# Patient Record
Sex: Female | Born: 1985 | Race: White | Hispanic: No | Marital: Married | Smoking: Former smoker
Health system: Southern US, Community
[De-identification: ages and names within clinical notes are randomized; demographics above are authoritative.]

## PROBLEM LIST (undated history)

## (undated) DIAGNOSIS — T7840XA Allergy, unspecified, initial encounter: Secondary | ICD-10-CM

## (undated) DIAGNOSIS — Z789 Other specified health status: Secondary | ICD-10-CM

## (undated) DIAGNOSIS — K219 Gastro-esophageal reflux disease without esophagitis: Secondary | ICD-10-CM

## (undated) HISTORY — DX: Other specified health status: Z78.9

## (undated) HISTORY — PX: NO PAST SURGERIES: SHX2092

## (undated) HISTORY — DX: Allergy, unspecified, initial encounter: T78.40XA

---

## 2014-07-30 ENCOUNTER — Other Ambulatory Visit: Payer: Self-pay | Admitting: Obstetrics & Gynecology

## 2014-07-30 DIAGNOSIS — O3680X1 Pregnancy with inconclusive fetal viability, fetus 1: Secondary | ICD-10-CM

## 2014-08-02 ENCOUNTER — Other Ambulatory Visit: Payer: Self-pay | Admitting: Obstetrics & Gynecology

## 2014-08-02 ENCOUNTER — Ambulatory Visit (INDEPENDENT_AMBULATORY_CARE_PROVIDER_SITE_OTHER): Payer: Medicaid Other

## 2014-08-02 DIAGNOSIS — O3680X1 Pregnancy with inconclusive fetal viability, fetus 1: Secondary | ICD-10-CM | POA: Diagnosis not present

## 2014-08-02 DIAGNOSIS — O30042 Twin pregnancy, dichorionic/diamniotic, second trimester: Secondary | ICD-10-CM | POA: Diagnosis not present

## 2014-08-02 DIAGNOSIS — Z36 Encounter for antenatal screening of mother: Secondary | ICD-10-CM

## 2014-08-02 DIAGNOSIS — IMO0001 Reserved for inherently not codable concepts without codable children: Secondary | ICD-10-CM

## 2014-08-02 NOTE — Progress Notes (Signed)
[place] 8wks dichorionic/diamniotic twins w/ys,pos fht,normal ov's bilat  BABY A :CRL 72mm,fht 172bpm                                                                                                                BABY B :CRL 19.12mm,fht 167

## 2014-08-05 LAB — US OB COMP ADDL GEST LESS 14 WKS

## 2014-08-11 ENCOUNTER — Ambulatory Visit (INDEPENDENT_AMBULATORY_CARE_PROVIDER_SITE_OTHER): Payer: Medicaid Other | Admitting: Advanced Practice Midwife

## 2014-08-11 ENCOUNTER — Other Ambulatory Visit (HOSPITAL_COMMUNITY)
Admission: RE | Admit: 2014-08-11 | Discharge: 2014-08-11 | Disposition: A | Discharge status: Home / Self Care | Payer: Self-pay | Source: Ambulatory Visit | Attending: Advanced Practice Midwife | Admitting: Advanced Practice Midwife

## 2014-08-11 ENCOUNTER — Encounter: Payer: Self-pay | Admitting: Advanced Practice Midwife

## 2014-08-11 VITALS — BP 110/78 | HR 84 | Ht 61.0 in | Wt 171.0 lb

## 2014-08-11 DIAGNOSIS — Z0283 Encounter for blood-alcohol and blood-drug test: Secondary | ICD-10-CM

## 2014-08-11 DIAGNOSIS — O30049 Twin pregnancy, dichorionic/diamniotic, unspecified trimester: Secondary | ICD-10-CM | POA: Insufficient documentation

## 2014-08-11 DIAGNOSIS — O2341 Unspecified infection of urinary tract in pregnancy, first trimester: Secondary | ICD-10-CM | POA: Diagnosis not present

## 2014-08-11 DIAGNOSIS — Z369 Encounter for antenatal screening, unspecified: Secondary | ICD-10-CM

## 2014-08-11 DIAGNOSIS — Z331 Pregnant state, incidental: Secondary | ICD-10-CM

## 2014-08-11 DIAGNOSIS — Z113 Encounter for screening for infections with a predominantly sexual mode of transmission: Secondary | ICD-10-CM | POA: Insufficient documentation

## 2014-08-11 DIAGNOSIS — O0991 Supervision of high risk pregnancy, unspecified, first trimester: Secondary | ICD-10-CM | POA: Diagnosis not present

## 2014-08-11 DIAGNOSIS — Z01411 Encounter for gynecological examination (general) (routine) with abnormal findings: Secondary | ICD-10-CM | POA: Insufficient documentation

## 2014-08-11 DIAGNOSIS — Z1389 Encounter for screening for other disorder: Secondary | ICD-10-CM

## 2014-08-11 DIAGNOSIS — O0990 Supervision of high risk pregnancy, unspecified, unspecified trimester: Secondary | ICD-10-CM | POA: Insufficient documentation

## 2014-08-11 DIAGNOSIS — Z124 Encounter for screening for malignant neoplasm of cervix: Secondary | ICD-10-CM

## 2014-08-11 DIAGNOSIS — Z3682 Encounter for antenatal screening for nuchal translucency: Secondary | ICD-10-CM

## 2014-08-11 DIAGNOSIS — O30041 Twin pregnancy, dichorionic/diamniotic, first trimester: Secondary | ICD-10-CM | POA: Diagnosis not present

## 2014-08-11 LAB — POCT URINALYSIS DIPSTICK
Glucose, UA: NEGATIVE
Ketones, UA: NEGATIVE
Leukocytes, UA: NEGATIVE
Nitrite, UA: NEGATIVE
PROTEIN UA: NEGATIVE
RBC UA: NEGATIVE

## 2014-08-11 NOTE — Progress Notes (Addendum)
  Subjective:    Natalie Knapp is a G1P0 4841w2d being seen today for her first obstetrical visit.  Her obstetrical history is significant for first pregnancy.  Pregnancy history fully reviewed.  Patient reports nausea daily with vomiting.  Has gained 6 lbs.  Had some vulvar itching once or twice after shaving.  Denies UTI sx  Filed Vitals:   08/11/14 0842 08/11/14 0851  BP: 110/78   Pulse: 84   Height:  5\' 1"  (1.549 m)  Weight: 171 lb (77.565 kg)     HISTORY: OB History  Gravida Para Term Preterm AB SAB TAB Ectopic Multiple Living  1             # Outcome Date GA Lbr Len/2nd Weight Sex Delivery Anes PTL Lv  1 Current              Past Medical History  Diagnosis Date  . Medical history non-contributory    Past Surgical History  Procedure Laterality Date  . No past surgeries     Family History  Problem Relation Age of Onset  . Scoliosis Mother   . Arthritis Mother   . Cancer Father     colon, lung, skin  . Heart disease Father   . Heart disease Maternal Grandmother   . Stroke Maternal Grandmother   . Thyroid disease Maternal Grandmother   . Hypertension Maternal Grandmother   . Diabetes Maternal Grandfather   . Heart disease Maternal Grandfather   . Heart disease Paternal Grandmother   . Diabetes Paternal Grandmother   . Dementia Paternal Grandmother   . Cancer Paternal Grandfather     melanoma     Exam       Pelvic Exam:    Perineum: Normal Perineum   Vulva: normal   Vagina:  normal mucosa, normal discharge, no palpable nodules   Uterus Normal, Gravid,     Cervix: normal   Adnexa: Not palpable   Urinary:  urethral meatus normal    System:     Skin: normal coloration and turgor, no rashes    Neurologic: oriented, normal, normal mood   Extremities: normal strength, tone, and muscle mass   HEENT PERRLA   Mouth/Teeth mucous membranes moist, normal dentition   Neck supple and no masses   Cardiovascular: regular rate and rhythm   Respiratory:  appears  well, vitals normal, no respiratory distress, acyanotic   Abdomen: soft, non-tender;  FHR: 155/160 US          Assessment:    Pregnancy: G1P0 Patient Active Problem List   Diagnosis Date Noted  . Supervision of high risk pregnancy in first trimester 08/11/2014  . Dichorionic diamniotic twin pregnancy in first trimester 08/11/2014        Plan:     Initial labs drawn. Continue prenatal vitamins  Problem list reviewed and updated  Reviewed n/v relief measures and warning s/s to report; Diclegis samples given, call for Rx if it works well  Rx Macrobid 100mg  BID X 7 for ASB Reviewed recommended weight gain based on pre-gravid BMI  Encouraged well-balanced diet Genetic Screening discussed Integrated Screen: requested.  Ultrasound discussed; fetal survey: requested.  Follow up in 3 weeks for NT/IT/HROB.  CRESENZO-DISHMAN,Aariyah Sampey 08/11/2014

## 2014-08-11 NOTE — Patient Instructions (Signed)
First Trimester of Pregnancy The first trimester of pregnancy is from week 1 until the end of week 12 (months 1 through 3). A week after a sperm fertilizes an egg, the egg will implant on the wall of the uterus. This embryo will begin to develop into a baby. Genes from you and your partner are forming the baby. The female genes determine whether the baby is a boy or a girl. At 6-8 weeks, the eyes and face are formed, and the heartbeat can be seen on ultrasound. At the end of 12 weeks, all the baby's organs are formed.  Now that you are pregnant, you will want to do everything you can to have a healthy baby. Two of the most important things are to get good prenatal care and to follow your health care provider's instructions. Prenatal care is all the medical care you receive before the baby's birth. This care will help prevent, find, and treat any problems during the pregnancy and childbirth. BODY CHANGES Your body goes through many changes during pregnancy. The changes vary from woman to woman.   You may gain or lose a couple of pounds at first.  You may feel sick to your stomach (nauseous) and throw up (vomit). If the vomiting is uncontrollable, call your health care provider.  You may tire easily.  You may develop headaches that can be relieved by medicines approved by your health care provider.  You may urinate more often. Painful urination may mean you have a bladder infection.  You may develop heartburn as a result of your pregnancy.  You may develop constipation because certain hormones are causing the muscles that push waste through your intestines to slow down.  You may develop hemorrhoids or swollen, bulging veins (varicose veins).  Your breasts may begin to grow larger and become tender. Your nipples may stick out more, and the tissue that surrounds them (areola) may become darker.  Your gums may bleed and may be sensitive to brushing and flossing.  Dark spots or blotches (chloasma,  mask of pregnancy) may develop on your face. This will likely fade after the baby is born.  Your menstrual periods will stop.  You may have a loss of appetite.  You may develop cravings for certain kinds of food.  You may have changes in your emotions from day to day, such as being excited to be pregnant or being concerned that something may go wrong with the pregnancy and baby.  You may have more vivid and strange dreams.  You may have changes in your hair. These can include thickening of your hair, rapid growth, and changes in texture. Some women also have hair loss during or after pregnancy, or hair that feels dry or thin. Your hair will most likely return to normal after your baby is born. WHAT TO EXPECT AT YOUR PRENATAL VISITS During a routine prenatal visit:  You will be weighed to make sure you and the baby are growing normally.  Your blood pressure will be taken.  Your abdomen will be measured to track your baby's growth.  The fetal heartbeat will be listened to starting around week 10 or 12 of your pregnancy.  Test results from any previous visits will be discussed. Your health care provider may ask you:  How you are feeling.  If you are feeling the baby move.  If you have had any abnormal symptoms, such as leaking fluid, bleeding, severe headaches, or abdominal cramping.  If you have any questions. Other tests   that may be performed during your first trimester include:  Blood tests to find your blood type and to check for the presence of any previous infections. They will also be used to check for low iron levels (anemia) and Rh antibodies. Later in the pregnancy, blood tests for diabetes will be done along with other tests if problems develop.  Urine tests to check for infections, diabetes, or protein in the urine.  An ultrasound to confirm the proper growth and development of the baby.  An amniocentesis to check for possible genetic problems.  Fetal screens for  spina bifida and Down syndrome.  You may need other tests to make sure you and the baby are doing well. HOME CARE INSTRUCTIONS  Medicines  Follow your health care provider's instructions regarding medicine use. Specific medicines may be either safe or unsafe to take during pregnancy.  Take your prenatal vitamins as directed.  If you develop constipation, try taking a stool softener if your health care provider approves. Diet  Eat regular, well-balanced meals. Choose a variety of foods, such as meat or vegetable-based protein, fish, milk and low-fat dairy products, vegetables, fruits, and whole grain breads and cereals. Your health care provider will help you determine the amount of weight gain that is right for you.  Avoid raw meat and uncooked cheese. These carry germs that can cause birth defects in the baby.  Eating four or five small meals rather than three large meals a day may help relieve nausea and vomiting. If you start to feel nauseous, eating a few soda crackers can be helpful. Drinking liquids between meals instead of during meals also seems to help nausea and vomiting.  If you develop constipation, eat more high-fiber foods, such as fresh vegetables or fruit and whole grains. Drink enough fluids to keep your urine clear or pale yellow. Activity and Exercise  Exercise only as directed by your health care provider. Exercising will help you:  Control your weight.  Stay in shape.  Be prepared for labor and delivery.  Experiencing pain or cramping in the lower abdomen or low back is a good sign that you should stop exercising. Check with your health care provider before continuing normal exercises.  Try to avoid standing for long periods of time. Move your legs often if you must stand in one place for a long time.  Avoid heavy lifting.  Wear low-heeled shoes, and practice good posture.  You may continue to have sex unless your health care provider directs you  otherwise. Relief of Pain or Discomfort  Wear a good support bra for breast tenderness.   Take warm sitz baths to soothe any pain or discomfort caused by hemorrhoids. Use hemorrhoid cream if your health care provider approves.   Rest with your legs elevated if you have leg cramps or low back pain.  If you develop varicose veins in your legs, wear support hose. Elevate your feet for 15 minutes, 3-4 times a day. Limit salt in your diet. Prenatal Care  Schedule your prenatal visits by the twelfth week of pregnancy. They are usually scheduled monthly at first, then more often in the last 2 months before delivery.  Write down your questions. Take them to your prenatal visits.  Keep all your prenatal visits as directed by your health care provider. Safety  Wear your seat belt at all times when driving.  Make a list of emergency phone numbers, including numbers for family, friends, the hospital, and police and fire departments. General Tips    Ask your health care provider for a referral to a local prenatal education class. Begin classes no later than at the beginning of month 6 of your pregnancy.  Ask for help if you have counseling or nutritional needs during pregnancy. Your health care provider can offer advice or refer you to specialists for help with various needs.  Do not use hot tubs, steam rooms, or saunas.  Do not douche or use tampons or scented sanitary pads.  Do not cross your legs for long periods of time.  Avoid cat litter boxes and soil used by cats. These carry germs that can cause birth defects in the baby and possibly loss of the fetus by miscarriage or stillbirth.  Avoid all smoking, herbs, alcohol, and medicines not prescribed by your health care provider. Chemicals in these affect the formation and growth of the baby.  Schedule a dentist appointment. At home, brush your teeth with a soft toothbrush and be gentle when you floss. SEEK MEDICAL CARE IF:   You have  dizziness.  You have mild pelvic cramps, pelvic pressure, or nagging pain in the abdominal area.  You have persistent nausea, vomiting, or diarrhea.  You have a bad smelling vaginal discharge.  You have pain with urination.  You notice increased swelling in your face, hands, legs, or ankles. SEEK IMMEDIATE MEDICAL CARE IF:   You have a fever.  You are leaking fluid from your vagina.  You have spotting or bleeding from your vagina.  You have severe abdominal cramping or pain.  You have rapid weight gain or loss.  You vomit blood or material that looks like coffee grounds.  You are exposed to German measles and have never had them.  You are exposed to fifth disease or chickenpox.  You develop a severe headache.  You have shortness of breath.  You have any kind of trauma, such as from a fall or a car accident. Document Released: 01/23/2001 Document Revised: 06/15/2013 Document Reviewed: 12/09/2012 [employer] Patient Information 2015 [employer]. This information is not intended to replace advice given to you by your health care provider. Make sure you discuss any questions you have with your health care provider.  

## 2014-08-12 ENCOUNTER — Encounter: Payer: Self-pay | Admitting: Advanced Practice Midwife

## 2014-08-12 DIAGNOSIS — F1190 Opioid use, unspecified, uncomplicated: Secondary | ICD-10-CM | POA: Insufficient documentation

## 2014-08-12 DIAGNOSIS — F1290 Cannabis use, unspecified, uncomplicated: Secondary | ICD-10-CM | POA: Insufficient documentation

## 2014-08-12 LAB — CYTOLOGY - PAP

## 2014-08-12 MED ORDER — NITROFURANTOIN MONOHYD MACRO 100 MG PO CAPS: 100.0000 mg | ORAL_CAPSULE | Freq: Two times a day (BID) | ORAL | Status: AC

## 2014-08-12 NOTE — Addendum Note (Signed)
Addended by: Jacklyn ShellRESENZO-DISHMON, Marianna Cid on: 08/12/2014 10:56 AM   Modules accepted: Orders

## 2014-08-13 LAB — URINE CULTURE

## 2014-08-18 LAB — PMP SCREEN PROFILE (10S), URINE
AMPHETAMINE SCRN UR: NEGATIVE ng/mL
BARBITURATE SCRN UR: NEGATIVE ng/mL
BENZODIAZEPINE SCREEN, URINE: NEGATIVE ng/mL
Cannabinoids Ur Ql Scn: POSITIVE ng/mL
Cocaine(Metab.)Screen, Urine: NEGATIVE ng/mL
Creatinine(Crt), U: 172.7 mg/dL (ref 20.0–300.0)
METHADONE SCREEN, URINE: NEGATIVE ng/mL
Opiate Scrn, Ur: POSITIVE ng/mL
Oxycodone+Oxymorphone Ur Ql Scn: POSITIVE ng/mL
PCP Scrn, Ur: NEGATIVE ng/mL
PROPOXYPHENE SCREEN: NEGATIVE ng/mL
Ph of Urine: 8.1 (ref 4.5–8.9)

## 2014-08-18 LAB — URINALYSIS, ROUTINE W REFLEX MICROSCOPIC
BILIRUBIN UA: NEGATIVE
Glucose, UA: NEGATIVE
Ketones, UA: NEGATIVE
NITRITE UA: POSITIVE — AB
PH UA: 8 — AB (ref 5.0–7.5)
RBC UA: NEGATIVE
Specific Gravity, UA: 1.023 (ref 1.005–1.030)
UUROB: 0.2 mg/dL (ref 0.2–1.0)

## 2014-08-18 LAB — HEPATITIS B SURFACE ANTIGEN: HEP B S AG: NEGATIVE

## 2014-08-18 LAB — CBC
HEMOGLOBIN: 13.3 g/dL (ref 11.1–15.9)
Hematocrit: 37.3 % (ref 34.0–46.6)
MCH: 32.7 pg (ref 26.6–33.0)
MCHC: 35.7 g/dL (ref 31.5–35.7)
MCV: 92 fL (ref 79–97)
Platelets: 382 10*3/uL — ABNORMAL HIGH (ref 150–379)
RBC: 4.07 x10E6/uL (ref 3.77–5.28)
RDW: 13.3 % (ref 12.3–15.4)
WBC: 12.3 10*3/uL — AB (ref 3.4–10.8)

## 2014-08-18 LAB — CYSTIC FIBROSIS MUTATION 97: GENE DIS ANAL CARRIER INTERP BLD/T-IMP: NOT DETECTED

## 2014-08-18 LAB — MICROSCOPIC EXAMINATION: Casts: NONE SEEN /lpf

## 2014-08-18 LAB — ABO/RH: RH TYPE: POSITIVE

## 2014-08-18 LAB — HIV ANTIBODY (ROUTINE TESTING W REFLEX): HIV SCREEN 4TH GENERATION: NONREACTIVE

## 2014-08-18 LAB — VARICELLA ZOSTER ANTIBODY, IGG: Varicella zoster IgG: 897 index (ref >165)

## 2014-08-18 LAB — RUBELLA SCREEN: RUBELLA: 1.22 {index} (ref >0.99)

## 2014-08-18 LAB — RPR: RPR Ser Ql: NONREACTIVE

## 2014-08-18 LAB — ANTIBODY SCREEN: ANTIBODY SCREEN: NEGATIVE

## 2014-08-23 ENCOUNTER — Encounter: Payer: Self-pay | Admitting: Women's Health

## 2014-08-27 ENCOUNTER — Other Ambulatory Visit: Payer: Self-pay | Admitting: Advanced Practice Midwife

## 2014-08-27 DIAGNOSIS — Z3682 Encounter for antenatal screening for nuchal translucency: Secondary | ICD-10-CM

## 2014-08-27 DIAGNOSIS — O30041 Twin pregnancy, dichorionic/diamniotic, first trimester: Secondary | ICD-10-CM

## 2014-09-01 ENCOUNTER — Encounter: Payer: Self-pay | Admitting: Women's Health

## 2014-09-01 ENCOUNTER — Ambulatory Visit (INDEPENDENT_AMBULATORY_CARE_PROVIDER_SITE_OTHER): Payer: Medicaid Other

## 2014-09-01 ENCOUNTER — Ambulatory Visit (INDEPENDENT_AMBULATORY_CARE_PROVIDER_SITE_OTHER): Payer: Medicaid Other | Admitting: Women's Health

## 2014-09-01 VITALS — BP 102/60 | HR 96 | Wt 171.0 lb

## 2014-09-01 DIAGNOSIS — O30041 Twin pregnancy, dichorionic/diamniotic, first trimester: Secondary | ICD-10-CM | POA: Diagnosis not present

## 2014-09-01 DIAGNOSIS — Z331 Pregnant state, incidental: Secondary | ICD-10-CM

## 2014-09-01 DIAGNOSIS — O0991 Supervision of high risk pregnancy, unspecified, first trimester: Secondary | ICD-10-CM

## 2014-09-01 DIAGNOSIS — O30049 Twin pregnancy, dichorionic/diamniotic, unspecified trimester: Secondary | ICD-10-CM

## 2014-09-01 DIAGNOSIS — Z3682 Encounter for antenatal screening for nuchal translucency: Secondary | ICD-10-CM

## 2014-09-01 DIAGNOSIS — Z36 Encounter for antenatal screening of mother: Secondary | ICD-10-CM | POA: Diagnosis not present

## 2014-09-01 DIAGNOSIS — Z1389 Encounter for screening for other disorder: Secondary | ICD-10-CM | POA: Diagnosis not present

## 2014-09-01 DIAGNOSIS — O30042 Twin pregnancy, dichorionic/diamniotic, second trimester: Secondary | ICD-10-CM

## 2014-09-01 DIAGNOSIS — F1190 Opioid use, unspecified, uncomplicated: Secondary | ICD-10-CM

## 2014-09-01 DIAGNOSIS — F1290 Cannabis use, unspecified, uncomplicated: Secondary | ICD-10-CM

## 2014-09-01 LAB — POCT URINALYSIS DIPSTICK
Blood, UA: NEGATIVE
GLUCOSE UA: NEGATIVE
NITRITE UA: NEGATIVE

## 2014-09-01 NOTE — Progress Notes (Signed)
High Risk Pregnancy Diagnosis(es): Di-Di Twin pregnancy G1P0 3335w2d Estimated Date of Delivery: 03/14/15 BP 102/60 mmHg  Pulse 96  Wt 171 lb (77.565 kg)  LMP 06/07/2014 (Exact Date)  Urinalysis: Positive for tr protein HPI:  Doing well BP, weight, and urine reviewed.  No fm yet. Denies cramping, lof, vb, uti s/s. No complaints. Counseled about +THC & opiates on UDS- states she does use THC about 2-3x/wk d/t n/v- advised complete cessation- to use diclegis she has for n/v Reports she took 1/2 percocet from her friend a couple of days before UDS d/t back pain, also took 1/2 of one the other day. Advised to stop- to use apap, warm baths, heating pads, etc for pain  Fundal Height:  12wks Fetal Heart rate:  161/153 u/s Edema:  none  Reviewed today's normal twin nt us, s/s to report All questions were answered Assessment: 5735w2d di di twin gestation Medication(s) Plans:  n/a Treatment Plan:  q 4wk u/s after 20wks, then 2x/wk testing @ 35wk w/ delivery @ 38wks Follow up in 4wks for high-risk OB appt and 2nd IT 1st IT/NT today

## 2014-09-01 NOTE — Patient Instructions (Signed)
Nausea & Vomiting  Have saltine crackers or pretzels by your bed and eat a few bites before you raise your head out of bed in the morning  Eat small frequent meals throughout the day instead of large meals  Drink plenty of fluids throughout the day to stay hydrated, just don't drink a lot of fluids with your meals.  This can make your stomach fill up faster making you feel sick  Do not brush your teeth right after you eat  Products with real ginger are good for nausea, like ginger ale and ginger hard candy Make sure it says made with real ginger!  Sucking on sour candy like lemon heads is also good for nausea  If your prenatal vitamins make you nauseated, take them at night so you will sleep through the nausea  Sea Bands  If you feel like you need medicine for the nausea & vomiting please let us know  If you are unable to keep any fluids or food down please let us know    Second Trimester of Pregnancy The second trimester is from week 13 through week 28, months 4 through 6. The second trimester is often a time when you feel your best. Your body has also adjusted to being pregnant, and you begin to feel better physically. Usually, morning sickness has lessened or quit completely, you may have more energy, and you may have an increase in appetite. The second trimester is also a time when the fetus is growing rapidly. At the end of the sixth month, the fetus is about 9 inches long and weighs about 1 pounds. You will likely begin to feel the baby move (quickening) between 18 and 20 weeks of the pregnancy. BODY CHANGES Your body goes through many changes during pregnancy. The changes vary from woman to woman.   Your weight will continue to increase. You will notice your lower abdomen bulging out.  You may begin to get stretch marks on your hips, abdomen, and breasts.  You may develop headaches that can be relieved by medicines approved by your health care provider.  You may urinate more  often because the fetus is pressing on your bladder.  You may develop or continue to have heartburn as a result of your pregnancy.  You may develop constipation because certain hormones are causing the muscles that push waste through your intestines to slow down.  You may develop hemorrhoids or swollen, bulging veins (varicose veins).  You may have back pain because of the weight gain and pregnancy hormones relaxing your joints between the bones in your pelvis and as a result of a shift in weight and the muscles that support your balance.  Your breasts will continue to grow and be tender.  Your gums may bleed and may be sensitive to brushing and flossing.  Dark spots or blotches (chloasma, mask of pregnancy) may develop on your face. This will likely fade after the baby is born.  A dark line from your belly button to the pubic area (linea nigra) may appear. This will likely fade after the baby is born.  You may have changes in your hair. These can include thickening of your hair, rapid growth, and changes in texture. Some women also have hair loss during or after pregnancy, or hair that feels dry or thin. Your hair will most likely return to normal after your baby is born. WHAT TO EXPECT AT YOUR PRENATAL VISITS During a routine prenatal visit:  You will be weighed to  sure you and the fetus are growing normally.  Your blood pressure will be taken.  Your abdomen will be measured to track your baby's growth.  The fetal heartbeat will be listened to.  Any test results from the previous visit will be discussed. Your health care provider may ask you:  How you are feeling.  If you are feeling the baby move.  If you have had any abnormal symptoms, such as leaking fluid, bleeding, severe headaches, or abdominal cramping.  If you have any questions. Other tests that may be performed during your second trimester include:  Blood tests that check for:  Low iron levels  (anemia).  Gestational diabetes (between 24 and 28 weeks).  Rh antibodies.  Urine tests to check for infections, diabetes, or protein in the urine.  An ultrasound to confirm the proper growth and development of the baby.  An amniocentesis to check for possible genetic problems.  Fetal screens for spina bifida and Down syndrome. HOME CARE INSTRUCTIONS   Avoid all smoking, herbs, alcohol, and unprescribed drugs. These chemicals affect the formation and growth of the baby.  Follow your health care provider's instructions regarding medicine use. There are medicines that are either safe or unsafe to take during pregnancy.  Exercise only as directed by your health care provider. Experiencing uterine cramps is a good sign to stop exercising.  Continue to eat regular, healthy meals.  Wear a good support bra for breast tenderness.  Do not use hot tubs, steam rooms, or saunas.  Wear your seat belt at all times when driving.  Avoid raw meat, uncooked cheese, cat litter boxes, and soil used by cats. These carry germs that can cause birth defects in the baby.  Take your prenatal vitamins.  Try taking a stool softener (if your health care provider approves) if you develop constipation. Eat more high-fiber foods, such as fresh vegetables or fruit and whole grains. Drink plenty of fluids to keep your urine clear or pale yellow.  Take warm sitz baths to soothe any pain or discomfort caused by hemorrhoids. Use hemorrhoid cream if your health care provider approves.  If you develop varicose veins, wear support hose. Elevate your feet for 15 minutes, 3-4 times a day. Limit salt in your diet.  Avoid heavy lifting, wear low heel shoes, and practice good posture.  Rest with your legs elevated if you have leg cramps or low back pain.  Visit your dentist if you have not gone yet during your pregnancy. Use a soft toothbrush to brush your teeth and be gentle when you floss.  A sexual relationship  may be continued unless your health care provider directs you otherwise.  Continue to go to all your prenatal visits as directed by your health care provider. SEEK MEDICAL CARE IF:   You have dizziness.  You have mild pelvic cramps, pelvic pressure, or nagging pain in the abdominal area.  You have persistent nausea, vomiting, or diarrhea.  You have a bad smelling vaginal discharge.  You have pain with urination. SEEK IMMEDIATE MEDICAL CARE IF:   You have a fever.  You are leaking fluid from your vagina.  You have spotting or bleeding from your vagina.  You have severe abdominal cramping or pain.  You have rapid weight gain or loss.  You have shortness of breath with chest pain.  You notice sudden or extreme swelling of your face, hands, ankles, feet, or legs.  You have not felt your baby move in over an hour.    You have severe headaches that do not go away with medicine.  You have vision changes. Document Released: 01/23/2001 Document Revised: 02/03/2013 Document Reviewed: 04/01/2012 [facility] Patient Information 2015 [place], [place]. This information is not intended to replace advice given to you by your health care provider. Make sure you discuss any questions you have with your health care provider.  Heartburn During Pregnancy  Heartburn is a burning sensation in the chest caused by stomach acid backing up into the esophagus. Heartburn is common in pregnancy because a certain hormone (progesterone) is released when a woman is pregnant. The progesterone hormone may relax the valve that separates the esophagus from the stomach. This allows acid to go up into the esophagus, causing heartburn. Heartburn may also happen in pregnancy because the enlarging uterus pushes up on the stomach, which pushes more acid into the esophagus. This is especially true in the later stages of pregnancy. Heartburn problems usually go away after giving birth. CAUSES  Heartburn is caused by stomach  acid backing up into the esophagus. During pregnancy, this may result from various things, including:   The progesterone hormone.  Changing hormone levels.  The growing uterus pushing stomach acid upward.  Large meals.  Certain foods and drinks.  Exercise.  Increased acid production. SIGNS AND SYMPTOMS   Burning pain in the chest or lower throat.  Bitter taste in the mouth.  Coughing. DIAGNOSIS  Your health care provider will typically diagnose heartburn by taking a careful history of your concern. Blood tests may be done to check for a certain type of bacteria that is associated with heartburn. Sometimes, heartburn is diagnosed by prescribing a heartburn medicine to see if the symptoms improve. In some cases, a procedure called an endoscopy may be done. In this procedure, a tube with a light and a camera on the end (endoscope) is used to examine the esophagus and the stomach. TREATMENT  Treatment will vary depending on the severity of your symptoms. Your health care provider may recommend:  Over-the-counter medicines (antacids, acid reducers) for mild heartburn.  Prescription medicines to decrease stomach acid or to protect your stomach lining.  Certain changes in your diet.  Elevating the head of your bed by putting blocks under the legs. This helps prevent stomach acid from backing up into the esophagus when you are lying down. HOME CARE INSTRUCTIONS   Only take over-the-counter or prescription medicines as directed by your health care provider.  Raise the head of your bed by putting blocks under the legs if instructed to do so by your health care provider. Sleeping with more pillows is not effective because it only changes the position of your head.  Do not exercise right after eating.  Avoid eating 2-3 hours before bed. Do not lie down right after eating.  Eat small meals throughout the day instead of three large meals.  Identify foods and beverages that make your  symptoms worse and avoid them. Foods you may want to avoid include:  Peppers.  Chocolate.  High-fat foods, including fried foods.  Spicy foods.  Garlic and onions.  Citrus fruits, including oranges, grapefruit, lemons, and limes.  Food containing tomatoes or tomato products.  Mint.  Carbonated and caffeinated drinks.  Vinegar. SEEK MEDICAL CARE IF:  You have abdominal pain of any kind.  You feel burning in your upper abdomen or chest, especially after eating or lying down.  You have nausea and vomiting.  Your stomach feels upset after you eat. SEEK IMMEDIATE MEDICAL CARE IF:  You have severe chest pain that goes down your arm or into your jaw or neck.  You feel sweaty, dizzy, or light-headed.  You become short of breath.  You vomit blood.  You have difficulty or pain with swallowing.  You have bloody or black, tarry stools.  You have episodes of heartburn more than 3 times a week, for more than 2 weeks. MAKE SURE YOU:  Understand these instructions.  Will watch your condition.  Will get help right away if you are not doing well or get worse. Document Released: 01/27/2000 Document Revised: 02/03/2013 Document Reviewed: 09/17/2012 [facility] Patient Information 2015 [place], [place]. This information is not intended to replace advice given to you by your health care provider. Make sure you discuss any questions you have with your health care provider.

## 2014-09-01 NOTE — Progress Notes (Signed)
US TWINS:DI/DI 12+2wks,measurements c/w dates, TWIN A:inferior,post pl,crl 66.44mm,NT 2.632mm,NB present,normal ov's bilat,fht 161bpm                                                                                        TWIN B:superior,post pl,crl 63.462mm,NT 1.289mm,NB present,fht 153bpm

## 2014-09-03 LAB — MATERNAL SCREEN, INTEGRATED #1
Crown Rump Length Twin B: 63.2 mm
Crown Rump Length: 66.4 mm
GEST. AGE ON COLLECTION DATE: 12.9 wk
Maternal Age at EDD: 29.1 years
NT TWIN B: 1.9 mm
NUMBER OF FETUSES: 2
Nuchal Translucency (NT): 2.2 mm
PAPP-A Value: 2247.3 ng/mL
WEIGHT: 171 [lb_av]

## 2014-09-29 ENCOUNTER — Encounter: Payer: Self-pay | Admitting: Obstetrics and Gynecology

## 2014-10-05 ENCOUNTER — Ambulatory Visit (INDEPENDENT_AMBULATORY_CARE_PROVIDER_SITE_OTHER): Payer: Medicaid Other | Admitting: Obstetrics and Gynecology

## 2014-10-05 ENCOUNTER — Encounter: Payer: Self-pay | Admitting: Obstetrics and Gynecology

## 2014-10-05 VITALS — BP 120/76 | HR 112 | Wt 173.0 lb

## 2014-10-05 DIAGNOSIS — Z331 Pregnant state, incidental: Secondary | ICD-10-CM

## 2014-10-05 DIAGNOSIS — O30042 Twin pregnancy, dichorionic/diamniotic, second trimester: Secondary | ICD-10-CM | POA: Diagnosis not present

## 2014-10-05 DIAGNOSIS — Z3A17 17 weeks gestation of pregnancy: Secondary | ICD-10-CM

## 2014-10-05 DIAGNOSIS — O30002 Twin pregnancy, unspecified number of placenta and unspecified number of amniotic sacs, second trimester: Secondary | ICD-10-CM

## 2014-10-05 DIAGNOSIS — Z1389 Encounter for screening for other disorder: Secondary | ICD-10-CM | POA: Diagnosis not present

## 2014-10-05 DIAGNOSIS — O0992 Supervision of high risk pregnancy, unspecified, second trimester: Secondary | ICD-10-CM | POA: Diagnosis not present

## 2014-10-05 LAB — POCT URINALYSIS DIPSTICK
Blood, UA: NEGATIVE
Glucose, UA: NEGATIVE
Leukocytes, UA: NEGATIVE
Nitrite, UA: NEGATIVE

## 2014-10-05 NOTE — Progress Notes (Signed)
High Risk Pregnancy Diagnosis(es):  Di/Di Twins @ 17 wk   Blood pressure 120/76, pulse 112, weight 173 lb (78.472 kg), last menstrual period 06/07/2014. HPI: G1P0 [redacted]w[redacted]d Estimated Date of Delivery: 03/14/15         BP weight and urine results reviewed and notable for negative. Patient reports    good fetal movement, denies any bleeding and no rupture of membranes symptoms or regular contractions .  Fundal Height:  20 cm = U-0  Fetal Heart rate:  156/163 twins u-0 Edema:  none Urinalysis: Negative  .Assessment:[redacted]w[redacted]d,   Twin Di/Di pregnancy,   Medication(s) Plans:  Add PNV Rx now.   Treatment Plan:        U/s in 2 wk anatomy x both babies  Follow up:           2 weeks for  u/s All questions were answered.

## 2014-10-05 NOTE — Progress Notes (Signed)
Pt denies any problems or concerns at this time.  

## 2014-10-05 NOTE — Patient Instructions (Signed)
Pelvic anatomy scan 2 wk

## 2014-10-06 ENCOUNTER — Telehealth: Payer: Self-pay | Admitting: *Deleted

## 2014-10-06 MED ORDER — PRENATAL PLUS 27-1 MG PO TABS: 1.0000 | ORAL_TABLET | Freq: Every day | ORAL | Status: DC

## 2014-10-06 NOTE — Telephone Encounter (Signed)
Rx sent to pharmacy per protocol.  

## 2014-10-13 ENCOUNTER — Other Ambulatory Visit: Payer: Self-pay | Admitting: Obstetrics and Gynecology

## 2014-10-13 DIAGNOSIS — Z1389 Encounter for screening for other disorder: Secondary | ICD-10-CM

## 2014-10-13 DIAGNOSIS — O30002 Twin pregnancy, unspecified number of placenta and unspecified number of amniotic sacs, second trimester: Secondary | ICD-10-CM

## 2014-10-20 ENCOUNTER — Encounter: Payer: Self-pay | Admitting: Obstetrics and Gynecology

## 2014-10-20 ENCOUNTER — Ambulatory Visit (INDEPENDENT_AMBULATORY_CARE_PROVIDER_SITE_OTHER): Payer: Medicaid Other | Admitting: Obstetrics and Gynecology

## 2014-10-20 ENCOUNTER — Ambulatory Visit (INDEPENDENT_AMBULATORY_CARE_PROVIDER_SITE_OTHER): Payer: Medicaid Other

## 2014-10-20 VITALS — BP 100/60 | HR 84 | Wt 171.0 lb

## 2014-10-20 DIAGNOSIS — O30049 Twin pregnancy, dichorionic/diamniotic, unspecified trimester: Secondary | ICD-10-CM | POA: Diagnosis not present

## 2014-10-20 DIAGNOSIS — Z1389 Encounter for screening for other disorder: Secondary | ICD-10-CM | POA: Diagnosis not present

## 2014-10-20 DIAGNOSIS — O30002 Twin pregnancy, unspecified number of placenta and unspecified number of amniotic sacs, second trimester: Secondary | ICD-10-CM

## 2014-10-20 DIAGNOSIS — Z331 Pregnant state, incidental: Secondary | ICD-10-CM | POA: Diagnosis not present

## 2014-10-20 DIAGNOSIS — Z36 Encounter for antenatal screening of mother: Secondary | ICD-10-CM | POA: Diagnosis not present

## 2014-10-20 DIAGNOSIS — O0993 Supervision of high risk pregnancy, unspecified, third trimester: Secondary | ICD-10-CM | POA: Diagnosis not present

## 2014-10-20 DIAGNOSIS — F1290 Cannabis use, unspecified, uncomplicated: Secondary | ICD-10-CM

## 2014-10-20 DIAGNOSIS — F1190 Opioid use, unspecified, uncomplicated: Secondary | ICD-10-CM

## 2014-10-20 NOTE — Progress Notes (Signed)
[place] 19+2 wks, DI/DI twins, cx 3.5, post pl gr 0,normal ov's bilat,Anatomy complete,no obvious abn seen.   BABY A cephalic, inferior,fht 154 bpm,svp of fluid 5.9cm,efw 322g     BABY B breech superior, fht 141 bpm,svp of fluid 6.6cm,efw 338g

## 2014-10-20 NOTE — Progress Notes (Signed)
Pt denies any problems or concerns at this time.  

## 2014-10-20 NOTE — Progress Notes (Signed)
This chart was scribed for Natalie Burrow, MD by Jarvis Morgan, ED Scribe. This patient was seen in room 2 and the patient's care was started at 2:52 PM.  High Risk Pregnancy Diagnosis(es):   Di/Di Twins @ 19 wk  G1P0 [redacted]w[redacted]d Estimated Date of Delivery: 03/14/15     HPI: The patient is being seen today for ongoing management of high risk pregnancy due to Di/Di twins Today she reports no complaints Patient reports good fetal movement, denies any bleeding and no rupture of membranes symptoms or regular contractions.   BP weight and urine results all reviewed and noted. Blood pressure 100/60, pulse 84, weight 171 lb (77.565 kg), last menstrual period 06/07/2014.  Fetal Surveillance Testing today:  none Fundal Height:  28 cm Fetal Heart rate:  154/163 Edema:  none Urinalysis: trace protein   Questions were answered.  Lab and sonogram results have been reviewed. Comments: normal   Assessment:  1.  Pregnancy at [redacted]w[redacted]d,  G1P0                              Estimated Date of Delivery: 03/14/15 :                         2.  Diamniotic dichorionic female/female twins                         Medication(s) Plans:  Continue PNV   Treatment Plan: Follow up in 4 weeks for appointment for high risk OB care, serial u/s for growth.  I personally performed the services described in this documentation, which was SCRIBED in my presence. The recorded information has been reviewed and considered accurate. It has been edited as necessary during review. Natalie Burrow, MD

## 2014-11-17 ENCOUNTER — Ambulatory Visit (INDEPENDENT_AMBULATORY_CARE_PROVIDER_SITE_OTHER): Payer: Medicaid Other | Admitting: Advanced Practice Midwife

## 2014-11-17 ENCOUNTER — Other Ambulatory Visit: Payer: Self-pay | Admitting: Advanced Practice Midwife

## 2014-11-17 VITALS — BP 104/68 | HR 92 | Wt 173.0 lb

## 2014-11-17 DIAGNOSIS — Z1389 Encounter for screening for other disorder: Secondary | ICD-10-CM | POA: Diagnosis not present

## 2014-11-17 DIAGNOSIS — O0992 Supervision of high risk pregnancy, unspecified, second trimester: Secondary | ICD-10-CM

## 2014-11-17 DIAGNOSIS — O30049 Twin pregnancy, dichorionic/diamniotic, unspecified trimester: Secondary | ICD-10-CM | POA: Diagnosis not present

## 2014-11-17 DIAGNOSIS — F1290 Cannabis use, unspecified, uncomplicated: Secondary | ICD-10-CM

## 2014-11-17 DIAGNOSIS — Z23 Encounter for immunization: Secondary | ICD-10-CM

## 2014-11-17 DIAGNOSIS — F1210 Cannabis abuse, uncomplicated: Secondary | ICD-10-CM | POA: Diagnosis not present

## 2014-11-17 DIAGNOSIS — Z331 Pregnant state, incidental: Secondary | ICD-10-CM | POA: Diagnosis not present

## 2014-11-17 LAB — POCT URINALYSIS DIPSTICK
Blood, UA: NEGATIVE
Glucose, UA: NEGATIVE
LEUKOCYTES UA: NEGATIVE
NITRITE UA: NEGATIVE

## 2014-11-17 MED ORDER — ESOMEPRAZOLE MAGNESIUM 20 MG PO PACK: 20.0000 mg | PACK | Freq: Every day | ORAL | Status: DC

## 2014-11-17 NOTE — Progress Notes (Signed)
Fetal Surveillance Testing today:  doppler   High Risk Pregnancy Diagnosis(es):   Di/Di twin  G64P0 [redacted]w[redacted]d Estimated Date of Delivery: 03/14/15  Blood pressure 104/68, pulse 92, weight 173 lb (78.472 kg), last menstrual period 06/07/2014.  Urinalysis: Negative   HPI: The patient is being seen today for ongoing management of di/di twins. Today she reports reflux/heartburn (refluxed with vomit in exam room!)  BP weight and urine results all reviewed and noted. Eats 2 big meals a day with frequent snacks.  Patient reports good fetal movement, denies any bleeding and no rupture of membranes symptoms or regular contractions.  Fundal Height:  31 Fetal Heart rate:  137/151 Edema:  no  Patient is without complaints other than noted in her HPI. All questions were answered.  All lab and sonogram results have been reviewed. Comments:  Needs growth [place] now  Assessment:  1.  Pregnancy at [redacted]w[redacted]d,  Estimated Date of Delivery: 03/14/15 :                          2.  Di/Di twins                        3.  UDS + THC/oxycodone at 1st PNV  Medication(s) Plans:  Nexium  rx daily  Treatment Plan:  Growth [place] q 4 weeks, NST twice weekly at 32 weeks ;  Repeat UDS today Return in about 4 weeks (around 12/15/2014), or ASAP for twin growth [place] and, for US:OB F/U:, HROB, PN2.  No orders of the defined types were placed in this encounter.   Orders Placed This Encounter  Procedures  . US OB Follow Up  . POCT urinalysis dipstick

## 2014-11-17 NOTE — Patient Instructions (Signed)

## 2014-11-19 LAB — PMP SCREEN PROFILE (10S), URINE
Amphetamine Screen, Ur: NEGATIVE ng/mL
BARBITURATE SCRN UR: NEGATIVE ng/mL
BENZODIAZEPINE SCREEN, URINE: NEGATIVE ng/mL
CANNABINOIDS UR QL SCN: POSITIVE ng/mL
Cocaine(Metab.)Screen, Urine: NEGATIVE ng/mL
Creatinine(Crt), U: 334 mg/dL — ABNORMAL HIGH (ref 20.0–300.0)
Methadone Scn, Ur: NEGATIVE ng/mL
Opiate Scrn, Ur: POSITIVE ng/mL
Oxycodone+Oxymorphone Ur Ql Scn: POSITIVE ng/mL
PCP Scrn, Ur: NEGATIVE ng/mL
PH UR, DRUG SCRN: 6.1 (ref 4.5–8.9)
Propoxyphene, Screen: NEGATIVE ng/mL

## 2014-11-22 ENCOUNTER — Ambulatory Visit (INDEPENDENT_AMBULATORY_CARE_PROVIDER_SITE_OTHER): Payer: Medicaid Other

## 2014-11-22 DIAGNOSIS — O30042 Twin pregnancy, dichorionic/diamniotic, second trimester: Secondary | ICD-10-CM

## 2014-11-22 DIAGNOSIS — O0992 Supervision of high risk pregnancy, unspecified, second trimester: Secondary | ICD-10-CM

## 2014-11-22 DIAGNOSIS — O30049 Twin pregnancy, dichorionic/diamniotic, unspecified trimester: Secondary | ICD-10-CM

## 2014-11-22 DIAGNOSIS — F1190 Opioid use, unspecified, uncomplicated: Secondary | ICD-10-CM

## 2014-11-22 DIAGNOSIS — F1290 Cannabis use, unspecified, uncomplicated: Secondary | ICD-10-CM

## 2014-11-22 NOTE — Progress Notes (Signed)
[place] TWINS DI/DI: CX 3.3cm,normal ov's bilat, BABY A  Cephalic inferior,post pl gr 0, measurements c/w dates 714g,56%,svp of fluid 5.5cm,fhr 148 bpm                                                                           BABY B   Trans head rt superior,post fundal pl gr 0,measurements c/w dates efw 712g 56%,svp of fluid 5.5cm,fhr 138bpm

## 2014-12-17 ENCOUNTER — Encounter: Payer: Medicaid Other | Admitting: Obstetrics and Gynecology

## 2014-12-17 ENCOUNTER — Other Ambulatory Visit: Payer: Medicaid Other

## 2014-12-20 ENCOUNTER — Other Ambulatory Visit: Payer: Self-pay | Admitting: Obstetrics & Gynecology

## 2014-12-20 ENCOUNTER — Other Ambulatory Visit: Payer: Self-pay | Admitting: Advanced Practice Midwife

## 2014-12-20 ENCOUNTER — Other Ambulatory Visit: Payer: Medicaid Other

## 2014-12-20 DIAGNOSIS — Z3689 Encounter for other specified antenatal screening: Secondary | ICD-10-CM

## 2014-12-20 DIAGNOSIS — Z131 Encounter for screening for diabetes mellitus: Secondary | ICD-10-CM

## 2014-12-20 DIAGNOSIS — Z369 Encounter for antenatal screening, unspecified: Secondary | ICD-10-CM

## 2014-12-21 ENCOUNTER — Ambulatory Visit (INDEPENDENT_AMBULATORY_CARE_PROVIDER_SITE_OTHER): Payer: Medicaid Other | Admitting: Obstetrics and Gynecology

## 2014-12-21 ENCOUNTER — Ambulatory Visit (INDEPENDENT_AMBULATORY_CARE_PROVIDER_SITE_OTHER): Payer: Medicaid Other

## 2014-12-21 ENCOUNTER — Encounter: Payer: Self-pay | Admitting: Obstetrics and Gynecology

## 2014-12-21 VITALS — BP 110/60 | HR 110 | Wt 177.0 lb

## 2014-12-21 DIAGNOSIS — O30003 Twin pregnancy, unspecified number of placenta and unspecified number of amniotic sacs, third trimester: Secondary | ICD-10-CM | POA: Diagnosis not present

## 2014-12-21 DIAGNOSIS — F1190 Opioid use, unspecified, uncomplicated: Secondary | ICD-10-CM

## 2014-12-21 DIAGNOSIS — Z1389 Encounter for screening for other disorder: Secondary | ICD-10-CM

## 2014-12-21 DIAGNOSIS — O99323 Drug use complicating pregnancy, third trimester: Secondary | ICD-10-CM

## 2014-12-21 DIAGNOSIS — F1290 Cannabis use, unspecified, uncomplicated: Secondary | ICD-10-CM

## 2014-12-21 DIAGNOSIS — Z331 Pregnant state, incidental: Secondary | ICD-10-CM

## 2014-12-21 DIAGNOSIS — O30049 Twin pregnancy, dichorionic/diamniotic, unspecified trimester: Secondary | ICD-10-CM

## 2014-12-21 DIAGNOSIS — O3133X0 Continuing pregnancy after elective fetal reduction of one fetus or more, third trimester, not applicable or unspecified: Secondary | ICD-10-CM

## 2014-12-21 DIAGNOSIS — O09893 Supervision of other high risk pregnancies, third trimester: Secondary | ICD-10-CM | POA: Diagnosis not present

## 2014-12-21 DIAGNOSIS — Z36 Encounter for antenatal screening of mother: Secondary | ICD-10-CM

## 2014-12-21 DIAGNOSIS — Z3689 Encounter for other specified antenatal screening: Secondary | ICD-10-CM

## 2014-12-21 LAB — GLUCOSE TOLERANCE, 2 HOURS W/ 1HR
GLUCOSE, FASTING: 73 mg/dL (ref 65–91)
Glucose, 1 hour: 157 mg/dL (ref 65–179)
Glucose, 2 hour: 142 mg/dL (ref 65–152)

## 2014-12-21 LAB — ANTIBODY SCREEN: Antibody Screen: NEGATIVE

## 2014-12-21 LAB — POCT URINALYSIS DIPSTICK
Blood, UA: NEGATIVE
GLUCOSE UA: NEGATIVE
LEUKOCYTES UA: NEGATIVE
Nitrite, UA: NEGATIVE
PROTEIN UA: 1

## 2014-12-21 LAB — CBC
Hematocrit: 32.9 % — ABNORMAL LOW (ref 34.0–46.6)
Hemoglobin: 11.1 g/dL (ref 11.1–15.9)
MCH: 31 pg (ref 26.6–33.0)
MCHC: 33.7 g/dL (ref 31.5–35.7)
MCV: 92 fL (ref 79–97)
PLATELETS: 396 10*3/uL — AB (ref 150–379)
RBC: 3.58 x10E6/uL — ABNORMAL LOW (ref 3.77–5.28)
RDW: 13.3 % (ref 12.3–15.4)
WBC: 13.8 10*3/uL — AB (ref 3.4–10.8)

## 2014-12-21 LAB — RPR: RPR Ser Ql: NONREACTIVE

## 2014-12-21 LAB — HIV ANTIBODY (ROUTINE TESTING W REFLEX): HIV SCREEN 4TH GENERATION: NONREACTIVE

## 2014-12-21 MED ORDER — METOCLOPRAMIDE HCL 10 MG PO TABS: 10.0000 mg | ORAL_TABLET | Freq: Three times a day (TID) | ORAL | Status: DC

## 2014-12-21 NOTE — Progress Notes (Signed)
US 28+1wks,cx 3.7cm,normal ov's,post pl gr 1,BABY A: svp of fluid 7.1cm,inferior breech,fhr 172 bpm,EFW 1274 g 61%,discordance 4%                                                                              BABY B: svp of fluid 6.7cm,superior trans head rt,fhr 161 bpm,EFW 1322g 65% discordance 0%

## 2014-12-21 NOTE — Progress Notes (Signed)
Pt states that she has had a lot of nausea since last night. Pt states that she has had some vomiting last night as well. Pt had sugar test yesterday and she thinks may have had something to with it.

## 2014-12-21 NOTE — Progress Notes (Signed)
High Risk Pregnancy Diagnosis(es):   Twins, +THC, + opiates , nauseated after GTT.  Blood pressure 110/60, pulse 110, weight 80.287 kg (177 lb), last menstrual period 06/07/2014. HPI: [id number] Estimated Date of Delivery: 03/14/15     Pt denies use of THC "since last weekend", and "ive stopped" Pt alleges she has NOT used opiates and doesn't know source.     BP weight and urine results reviewed and notable for mild tachycardia CBC    Component Value Date/Time   WBC 13.8* 12/20/2014 0859   RBC 3.58* 12/20/2014 0859   HCT 32.9* 12/20/2014 0859   MCH 31.0 12/20/2014 0859   MCHC 33.7 12/20/2014 0859   RDW 13.3 12/20/2014 0859    . Patient reports    good fetal movement, denies any bleeding and no rupture of membranes symptoms or regular contractions .  Fundal Height:  34 Fetal Heart rate:  As above in grid  Edema:  Neg. Urinalysis: Positive for protein 1 1+  .Assessment:5793w1d,   Twins, opiates unexplained  Medication(s) Plans:  Add Reglan 10 tid for nausea.   Treatment Plan:        U/S in 2 wk  Follow up:           2 weeks for  U/s growth. uds All questions were answered.

## 2014-12-22 LAB — PMP SCREEN PROFILE (10S), URINE
AMPHETAMINE SCRN UR: NEGATIVE ng/mL
Barbiturate Screen, Ur: NEGATIVE ng/mL
Benzodiazepine Screen, Urine: NEGATIVE ng/mL
COCAINE(METAB.) SCREEN, URINE: NEGATIVE ng/mL
Cannabinoids Ur Ql Scn: POSITIVE ng/mL
Creatinine(Crt), U: 165.7 mg/dL (ref 20.0–300.0)
METHADONE SCREEN, URINE: NEGATIVE ng/mL
OPIATE SCRN UR: NEGATIVE ng/mL
Oxycodone+Oxymorphone Ur Ql Scn: POSITIVE ng/mL
PCP SCRN UR: NEGATIVE ng/mL
PROPOXYPHENE SCREEN: NEGATIVE ng/mL
Ph of Urine: 6.3 (ref 4.5–8.9)

## 2014-12-28 ENCOUNTER — Other Ambulatory Visit: Payer: Self-pay | Admitting: Obstetrics and Gynecology

## 2014-12-28 DIAGNOSIS — Z3689 Encounter for other specified antenatal screening: Secondary | ICD-10-CM

## 2015-01-04 ENCOUNTER — Encounter: Payer: Medicaid Other | Admitting: Obstetrics and Gynecology

## 2015-01-04 ENCOUNTER — Other Ambulatory Visit: Payer: Medicaid Other

## 2015-01-10 ENCOUNTER — Encounter: Payer: Self-pay | Admitting: Obstetrics and Gynecology

## 2015-01-10 ENCOUNTER — Ambulatory Visit (INDEPENDENT_AMBULATORY_CARE_PROVIDER_SITE_OTHER): Payer: Medicaid Other

## 2015-01-10 ENCOUNTER — Ambulatory Visit (INDEPENDENT_AMBULATORY_CARE_PROVIDER_SITE_OTHER): Payer: Medicaid Other | Admitting: Obstetrics and Gynecology

## 2015-01-10 ENCOUNTER — Encounter: Payer: Medicaid Other | Admitting: Women's Health

## 2015-01-10 VITALS — BP 100/60 | HR 118 | Wt 176.0 lb

## 2015-01-10 DIAGNOSIS — Z1389 Encounter for screening for other disorder: Secondary | ICD-10-CM | POA: Diagnosis not present

## 2015-01-10 DIAGNOSIS — O30003 Twin pregnancy, unspecified number of placenta and unspecified number of amniotic sacs, third trimester: Secondary | ICD-10-CM | POA: Diagnosis not present

## 2015-01-10 DIAGNOSIS — Z36 Encounter for antenatal screening of mother: Secondary | ICD-10-CM | POA: Diagnosis not present

## 2015-01-10 DIAGNOSIS — Z3A31 31 weeks gestation of pregnancy: Secondary | ICD-10-CM

## 2015-01-10 DIAGNOSIS — Z331 Pregnant state, incidental: Secondary | ICD-10-CM | POA: Diagnosis not present

## 2015-01-10 DIAGNOSIS — O99323 Drug use complicating pregnancy, third trimester: Secondary | ICD-10-CM

## 2015-01-10 DIAGNOSIS — O30043 Twin pregnancy, dichorionic/diamniotic, third trimester: Secondary | ICD-10-CM

## 2015-01-10 DIAGNOSIS — O321XX1 Maternal care for breech presentation, fetus 1: Secondary | ICD-10-CM | POA: Diagnosis not present

## 2015-01-10 DIAGNOSIS — F1290 Cannabis use, unspecified, uncomplicated: Secondary | ICD-10-CM

## 2015-01-10 DIAGNOSIS — Z3689 Encounter for other specified antenatal screening: Secondary | ICD-10-CM

## 2015-01-10 DIAGNOSIS — O30049 Twin pregnancy, dichorionic/diamniotic, unspecified trimester: Secondary | ICD-10-CM

## 2015-01-10 DIAGNOSIS — F1190 Opioid use, unspecified, uncomplicated: Secondary | ICD-10-CM

## 2015-01-10 LAB — POCT URINALYSIS DIPSTICK
Glucose, UA: NEGATIVE
Ketones, UA: NEGATIVE
LEUKOCYTES UA: NEGATIVE
NITRITE UA: NEGATIVE
RBC UA: NEGATIVE

## 2015-01-10 NOTE — Progress Notes (Signed)
Patient ID: Natalie AbleKatie S Duncan, female   DOB: 07/27/1985, 29 y.o.   MRN: [id number]   High Risk Pregnancy Diagnosis(es):   Twins, +THC, + opiates , nauseated after GTT  G1P0 2433w0d Estimated Date of Delivery: 03/14/15   HPI: The patient is being seen today for ongoing management of HROB. Today she reports no complaints.  Patient reports good fetal movement, denies any bleeding and no rupture of membranes symptoms or regular contractions.   BP weight and urine results all reviewed and noted. Blood pressure 100/60, pulse 118, weight 176 lb (79.833 kg), last menstrual period 06/07/2014.  Fetal Surveillance Testing today:  U/s growth Fundal Height:  36 Fetal Heart rate:  137 bpm, 150 bpm Edema:  n/a Urinalysis: trace protein   Questions were answered.  Lab and sonogram results have been reviewed. Comments: normal u/s growth, baby A breech.   Assessment:  1.  Pregnancy at 1433w0d,  Estimated Date of Delivery: 03/14/15                         2.  Normal u/s growth, baby A breech   Medication(s) Plans:  unchanged  Treatment Plan:  Discussed with pt that if baby A stays breech, will consider Cesarean section.  Follow up in 2 weeks for appointment for high risk OB care,    By signing my name below, I, Ronney LionSuzanne Le, attest that this documentation has been prepared under the direction and in the presence of Tilda BurrowJohn Avanelle Pixley V, MD. Electronically Signed: Ronney LionSuzanne Le, ED Scribe. 01/10/2015. 2:25 PM.  I personally performed the services described in this documentation, which was SCRIBED in my presence. The recorded information has been reviewed and considered accurate. It has been edited as necessary during review. Tilda BurrowFERGUSON,Addy Mcmannis V, MD  (scribe attestation statement)

## 2015-01-10 NOTE — Progress Notes (Signed)
US 31wks,DI/DI twins,cx 4.2cm,post pl gr 2,normal ov's bilat,BABY A breech,fhr 137 bpm,svp 4.3cm,efw 1733g 53%                                                                                                   BABY B trans head rt,fhr 150 bpm,svp 4.8cm,1731g 53%,discordance 0%

## 2015-01-10 NOTE — Progress Notes (Signed)
Pt denies any problems or concerns at this time.  

## 2015-01-11 LAB — PMP SCREEN PROFILE (10S), URINE
AMPHETAMINE SCRN UR: NEGATIVE ng/mL
BARBITURATE SCRN UR: NEGATIVE ng/mL
Benzodiazepine Screen, Urine: NEGATIVE ng/mL
CANNABINOIDS UR QL SCN: POSITIVE ng/mL
COCAINE(METAB.) SCREEN, URINE: NEGATIVE ng/mL
Creatinine(Crt), U: 95.9 mg/dL (ref 20.0–300.0)
Methadone Scn, Ur: NEGATIVE ng/mL
Opiate Scrn, Ur: NEGATIVE ng/mL
Oxycodone+Oxymorphone Ur Ql Scn: NEGATIVE ng/mL
PCP SCRN UR: NEGATIVE ng/mL
Ph of Urine: 7.2 (ref 4.5–8.9)
Propoxyphene, Screen: NEGATIVE ng/mL

## 2015-01-24 ENCOUNTER — Encounter: Payer: Medicaid Other | Admitting: Obstetrics and Gynecology

## 2015-01-31 ENCOUNTER — Encounter: Payer: Self-pay | Admitting: Obstetrics and Gynecology

## 2015-01-31 ENCOUNTER — Ambulatory Visit (INDEPENDENT_AMBULATORY_CARE_PROVIDER_SITE_OTHER): Payer: Medicaid Other | Admitting: Obstetrics and Gynecology

## 2015-01-31 VITALS — BP 112/62 | HR 110 | Wt 179.5 lb

## 2015-01-31 DIAGNOSIS — Z1389 Encounter for screening for other disorder: Secondary | ICD-10-CM

## 2015-01-31 DIAGNOSIS — Z331 Pregnant state, incidental: Secondary | ICD-10-CM

## 2015-01-31 DIAGNOSIS — O30003 Twin pregnancy, unspecified number of placenta and unspecified number of amniotic sacs, third trimester: Secondary | ICD-10-CM

## 2015-01-31 DIAGNOSIS — O30049 Twin pregnancy, dichorionic/diamniotic, unspecified trimester: Secondary | ICD-10-CM

## 2015-01-31 DIAGNOSIS — O0993 Supervision of high risk pregnancy, unspecified, third trimester: Secondary | ICD-10-CM

## 2015-01-31 DIAGNOSIS — O99323 Drug use complicating pregnancy, third trimester: Secondary | ICD-10-CM

## 2015-01-31 LAB — POCT URINALYSIS DIPSTICK
Blood, UA: NEGATIVE
GLUCOSE UA: NEGATIVE
LEUKOCYTES UA: NEGATIVE
NITRITE UA: NEGATIVE

## 2015-01-31 NOTE — Progress Notes (Signed)
Patient ID: Natalie Knapp, female   DOB: 03/24/1985, 29 y.o.   MRN: [id number]   High Risk Pregnancy Diagnosis(es):   [name], +THC, + opiates , nauseated after GTT  G1P0 5662w0d Estimated Date of Delivery: 03/14/15  Blood pressure 112/62, pulse 110, weight 179 lb 8 oz (81.421 kg), last menstrual period 06/07/2014.  Urinalysis: Negative  HPI: HPI Comments: Natalie Knapp is a 29 y.o. female G1P0 who presents complaining of intermittent, mild uterine contractions 1-2x a week (last episode this morning). Pt also notes and episode of increased swelling to bilateral feet one day last week after eating fatty foods, which resolved after heating pad use.  Pt states she plans on vaginal delivery as her first choice, and cesarean section if necessary. Last US at 32 weeks showed normal u/s growth, baby A breech. Pt states that she discontinued marijuana use as advised 3 weeks ago. She denies HA, visual changes, abdominal pain.  BP weight and urine results all reviewed and noted. Patient reports good fetal movement, denies any bleeding and no rupture of membranes symptoms or regular contractions.   Fundal Height:  37 cm Fetal Heart rate:  167 bpm, 148 bpm Edema:  none  Patient is without complaints. All questions were answered.  Assessment:  8062w0d, G1P0, Estimated Date of Delivery 03/14/15  Medication(s) Plans:  unchanged  Treatment Plan:  Discussed with pt that if baby A stays breech, will consider Cesarean section. Scheduled for bi-weekly NST next week, Plan for group B strep test at 36 weeks.    Follow up in 1 week for NST and high risk OB management    By signing my name below, I, Doreatha MartinEva Mathews, attest that this documentation has been prepared under the direction and in the presence of Tilda BurrowJohn Pennye Beeghly V, MD. Electronically Signed: Doreatha MartinEva Mathews, ED Scribe. 01/31/2015. 9:19 AM.  I personally performed the services described in this documentation, which was SCRIBED in my presence. The recorded  information has been reviewed and considered accurate. It has been edited as necessary during review. Tilda BurrowFERGUSON,Taliana Mersereau V, MD

## 2015-01-31 NOTE — Progress Notes (Signed)
Pt denies any problems or concerns at this time.  

## 2015-02-08 ENCOUNTER — Ambulatory Visit (INDEPENDENT_AMBULATORY_CARE_PROVIDER_SITE_OTHER): Payer: Medicaid Other | Admitting: Advanced Practice Midwife

## 2015-02-08 ENCOUNTER — Encounter: Payer: Self-pay | Admitting: Advanced Practice Midwife

## 2015-02-08 VITALS — BP 120/80 | HR 84 | Wt 185.0 lb

## 2015-02-08 DIAGNOSIS — F1210 Cannabis abuse, uncomplicated: Secondary | ICD-10-CM

## 2015-02-08 DIAGNOSIS — O30049 Twin pregnancy, dichorionic/diamniotic, unspecified trimester: Secondary | ICD-10-CM | POA: Diagnosis not present

## 2015-02-08 DIAGNOSIS — O99323 Drug use complicating pregnancy, third trimester: Secondary | ICD-10-CM | POA: Diagnosis not present

## 2015-02-08 DIAGNOSIS — Z1389 Encounter for screening for other disorder: Secondary | ICD-10-CM

## 2015-02-08 DIAGNOSIS — Z331 Pregnant state, incidental: Secondary | ICD-10-CM

## 2015-02-08 DIAGNOSIS — Z3A35 35 weeks gestation of pregnancy: Secondary | ICD-10-CM | POA: Diagnosis not present

## 2015-02-08 DIAGNOSIS — F1290 Cannabis use, unspecified, uncomplicated: Secondary | ICD-10-CM

## 2015-02-08 DIAGNOSIS — O0993 Supervision of high risk pregnancy, unspecified, third trimester: Secondary | ICD-10-CM | POA: Diagnosis not present

## 2015-02-08 LAB — POCT URINALYSIS DIPSTICK
GLUCOSE UA: NEGATIVE
Ketones, UA: NEGATIVE
Leukocytes, UA: NEGATIVE
NITRITE UA: NEGATIVE
RBC UA: NEGATIVE

## 2015-02-08 NOTE — Patient Instructions (Signed)

## 2015-02-08 NOTE — Progress Notes (Signed)
Fetal Surveillance Testing today:  NST   High Risk Pregnancy Diagnosis(es):   Di/Di twins; opiate and THC use  G1P0 5264w1d Estimated Date of Delivery: 03/14/15  Blood pressure 120/80, pulse 84, weight 185 lb (83.915 kg), last menstrual period 06/07/2014.  Urinalysis: Negative   HPI: The patient is being seen today for ongoing management of Di/Di twins. Today she reports no complaints   BP weight and urine results all reviewed and noted. Patient reports good fetal movement, denies any bleeding and no rupture of membranes symptoms or regular contractions.  Fundal Height:  42 Fetal Heart rate:  145/150 Edema:  trace  Patient is without complaints other than noted in her HPI. All questions were answered.  All lab and sonogram results have been reviewed. Comments:  Reactive NST X2  Assessment:  1.  Pregnancy at 9064w1d,  Estimated Date of Delivery: 03/14/15 :  Di/Di twins                        2.  Multiple + UDS for opiates (without rx) and THC: pt denies use                        3.    Medication(s) Plans:  none  Treatment Plan:  Growth US/BPP Thursday; contines twice weekly testing  Return in 2 days (on 02/10/2015) for HROB, US:EFW, US:BPP. for appointment for high risk OB care  No orders of the defined types were placed in this encounter.   Orders Placed This Encounter  Procedures  . US OB Follow Up  . US FETAL BPP WO NON STRESS  . Pain Management Screening Profile (10S)  . POCT urinalysis dipstick

## 2015-02-09 LAB — PMP SCREEN PROFILE (10S), URINE
Amphetamine Screen, Ur: NEGATIVE ng/mL
BENZODIAZEPINE SCREEN, URINE: NEGATIVE ng/mL
Barbiturate Screen, Ur: NEGATIVE ng/mL
CANNABINOIDS UR QL SCN: POSITIVE ng/mL
Cocaine(Metab.)Screen, Urine: NEGATIVE ng/mL
Creatinine(Crt), U: 139.6 mg/dL (ref 20.0–300.0)
Methadone Scn, Ur: NEGATIVE ng/mL
OPIATE SCRN UR: NEGATIVE ng/mL
OXYCODONE+OXYMORPHONE UR QL SCN: POSITIVE ng/mL
PCP Scrn, Ur: NEGATIVE ng/mL
Ph of Urine: 8 (ref 4.5–8.9)
Propoxyphene, Screen: NEGATIVE ng/mL

## 2015-02-10 ENCOUNTER — Other Ambulatory Visit: Payer: Self-pay | Admitting: Advanced Practice Midwife

## 2015-02-10 DIAGNOSIS — O30049 Twin pregnancy, dichorionic/diamniotic, unspecified trimester: Secondary | ICD-10-CM

## 2015-02-10 DIAGNOSIS — Z3689 Encounter for other specified antenatal screening: Secondary | ICD-10-CM

## 2015-02-11 ENCOUNTER — Other Ambulatory Visit: Payer: Self-pay | Admitting: Obstetrics and Gynecology

## 2015-02-11 ENCOUNTER — Ambulatory Visit (INDEPENDENT_AMBULATORY_CARE_PROVIDER_SITE_OTHER): Payer: Medicaid Other

## 2015-02-11 ENCOUNTER — Encounter: Payer: Self-pay | Admitting: Obstetrics and Gynecology

## 2015-02-11 ENCOUNTER — Ambulatory Visit (INDEPENDENT_AMBULATORY_CARE_PROVIDER_SITE_OTHER): Payer: Medicaid Other | Admitting: Obstetrics and Gynecology

## 2015-02-11 VITALS — BP 110/70 | HR 80 | Wt 182.2 lb

## 2015-02-11 DIAGNOSIS — O99323 Drug use complicating pregnancy, third trimester: Secondary | ICD-10-CM

## 2015-02-11 DIAGNOSIS — O30049 Twin pregnancy, dichorionic/diamniotic, unspecified trimester: Secondary | ICD-10-CM

## 2015-02-11 DIAGNOSIS — F1210 Cannabis abuse, uncomplicated: Secondary | ICD-10-CM

## 2015-02-11 DIAGNOSIS — O321XX1 Maternal care for breech presentation, fetus 1: Secondary | ICD-10-CM

## 2015-02-11 DIAGNOSIS — O30043 Twin pregnancy, dichorionic/diamniotic, third trimester: Secondary | ICD-10-CM

## 2015-02-11 DIAGNOSIS — O0993 Supervision of high risk pregnancy, unspecified, third trimester: Secondary | ICD-10-CM

## 2015-02-11 DIAGNOSIS — Z3A36 36 weeks gestation of pregnancy: Secondary | ICD-10-CM | POA: Diagnosis not present

## 2015-02-11 DIAGNOSIS — F1290 Cannabis use, unspecified, uncomplicated: Secondary | ICD-10-CM

## 2015-02-11 DIAGNOSIS — F1190 Opioid use, unspecified, uncomplicated: Secondary | ICD-10-CM

## 2015-02-11 DIAGNOSIS — Z3689 Encounter for other specified antenatal screening: Secondary | ICD-10-CM

## 2015-02-11 NOTE — Progress Notes (Signed)
US 35+4wks DI/DI twins:  BABY A breech left,efw 2447g 27%,post pl gr 3,svp of fluid 4.1cm,fhr 132 bpm,discordance 7%,BPP 8/8                                            BABY B trans head right,efw 2631g 40%,post pl gr 3,svp of fluid 6.3cm,fhr 138 bpm,discordance 0%,BPP 8/8

## 2015-02-11 NOTE — Progress Notes (Signed)
Patient ID: Natalie Knapp, female   DOB: 12/06/1985, 29 y.o.   MRN: [id number]   High Risk Pregnancy Diagnosis(es):   Natalie Knapp, +THC, + opiates , nauseated after GTT  G1P0 512w4d Estimated Date of Delivery: 03/14/15     HPI: The patient is being seen today for ongoing management of high risk pregnancy. Today she reports no complains. Pt states she plans on using oral contraceptives after delivery.   Patient reports good fetal movement, denies any bleeding and no rupture of membranes symptoms or regular contractions.   BP weight and urine results all reviewed and noted. Blood pressure 110/70, pulse 80, weight 182 lb 3.2 oz (82.645 kg), last menstrual period 06/07/2014.  Fetal Surveillance Testing today:  Fetal US; Baby A breech, Baby B vertex  Fundal Height:  33 cm Fetal Heart rate:  Baby A- 132 bpm; Baby B- 138 bpm  Edema:  none Urinalysis: Not examined pt dropped her cup in the toilet when she arrived   Questions were answered.  Lab and sonogram results have been reviewed. Comments: abnormal: Fetal US showed baby A breech; baby B vertex  Assessment:  1.  Pregnancy at 4012w4d,  Estimated Date of Delivery: 03/14/15 :  G1P0                         2.  Di/Di Knapp                        3.  Baby A breech on US today  Medication(s) Plans:  none  Treatment Plan:  Schedule cesarean section for 02/21/15 at 9:15AM.  MRN [id number]  Follow up in 2 weeks for appointment for high risk OB care . Tilda BurrowFERGUSON,Natalie Hovater V, MD   By signing my name below, I, Doreatha MartinEva Mathews, attest that this documentation has been prepared under the direction and in the presence of Tilda BurrowJohn Ashton Belote V, MD. Electronically Signed: Doreatha MartinEva Mathews, ED Scribe. 02/11/2015. 12:52 PM.  .I personally performed the services described in this documentation, which was SCRIBED in my presence. The recorded information has been reviewed and considered accurate. It has been edited as necessary during review. Tilda BurrowFERGUSON,Shakeia Krus V, MD

## 2015-02-15 ENCOUNTER — Other Ambulatory Visit: Payer: Medicaid Other | Admitting: Obstetrics & Gynecology

## 2015-02-15 ENCOUNTER — Encounter: Payer: Self-pay | Admitting: Obstetrics & Gynecology

## 2015-02-17 NOTE — Patient Instructions (Signed)
Your procedure is scheduled on:  Monday, Jan. 9, 2017  [facility] at:  7:45 A.M.  Pick up the phone at the desk and dial [phone].  Call this number if you have problems the morning of surgery: [phone].  Remember: Do NOT eat food or drink after:  Midnight Sunday Take these medicines the morning of surgery with a SIP OF WATER:  Nexium  Do NOT wear jewelry (body piercing), metal hair clips/bobby pins, or nail polish. Do NOT wear lotions, powders, or perfumes.  You may wear deoderant. Do NOT shave for 48 hours prior to surgery. Do NOT bring valuables to the hospital.  Leave suitcase in car.  After surgery it may be brought to your room.  For patients admitted to the hospital, checkout time is 11:00 AM the day of discharge.

## 2015-02-18 ENCOUNTER — Ambulatory Visit (INDEPENDENT_AMBULATORY_CARE_PROVIDER_SITE_OTHER): Payer: Medicaid Other | Admitting: Obstetrics and Gynecology

## 2015-02-18 ENCOUNTER — Encounter (HOSPITAL_COMMUNITY): Payer: Self-pay

## 2015-02-18 ENCOUNTER — Encounter (HOSPITAL_COMMUNITY)
Admission: RE | Admit: 2015-02-18 | Discharge: 2015-02-18 | Disposition: A | Discharge status: Home / Self Care | Payer: Medicaid Other | Source: Ambulatory Visit | Attending: Obstetrics and Gynecology | Admitting: Obstetrics and Gynecology

## 2015-02-18 ENCOUNTER — Encounter: Payer: Self-pay | Admitting: Obstetrics and Gynecology

## 2015-02-18 VITALS — BP 120/78 | HR 96 | Wt 182.0 lb

## 2015-02-18 VITALS — BP 122/88 | HR 98 | Temp 97.5°F | Resp 20 | Ht 61.5 in | Wt 181.5 lb

## 2015-02-18 DIAGNOSIS — O30049 Twin pregnancy, dichorionic/diamniotic, unspecified trimester: Secondary | ICD-10-CM

## 2015-02-18 DIAGNOSIS — Z98891 History of uterine scar from previous surgery: Secondary | ICD-10-CM | POA: Diagnosis not present

## 2015-02-18 DIAGNOSIS — Z3A37 37 weeks gestation of pregnancy: Secondary | ICD-10-CM | POA: Insufficient documentation

## 2015-02-18 DIAGNOSIS — O0993 Supervision of high risk pregnancy, unspecified, third trimester: Secondary | ICD-10-CM

## 2015-02-18 DIAGNOSIS — Z01812 Encounter for preprocedural laboratory examination: Secondary | ICD-10-CM | POA: Diagnosis not present

## 2015-02-18 DIAGNOSIS — F1290 Cannabis use, unspecified, uncomplicated: Secondary | ICD-10-CM

## 2015-02-18 DIAGNOSIS — Z331 Pregnant state, incidental: Secondary | ICD-10-CM

## 2015-02-18 DIAGNOSIS — O09893 Supervision of other high risk pregnancies, third trimester: Secondary | ICD-10-CM

## 2015-02-18 DIAGNOSIS — Z1389 Encounter for screening for other disorder: Secondary | ICD-10-CM | POA: Diagnosis not present

## 2015-02-18 DIAGNOSIS — F1190 Opioid use, unspecified, uncomplicated: Secondary | ICD-10-CM

## 2015-02-18 HISTORY — DX: Gastro-esophageal reflux disease without esophagitis: K21.9

## 2015-02-18 LAB — TYPE AND SCREEN
ABO/RH(D): O POS
Antibody Screen: NEGATIVE

## 2015-02-18 LAB — CBC
HEMATOCRIT: 31 % — AB (ref 36.0–46.0)
Hemoglobin: 10.3 g/dL — ABNORMAL LOW (ref 12.0–15.0)
MCH: 28.3 pg (ref 26.0–34.0)
MCHC: 33.2 g/dL (ref 30.0–36.0)
MCV: 85.2 fL (ref 78.0–100.0)
Platelets: 389 10*3/uL (ref 150–400)
RBC: 3.64 MIL/uL — ABNORMAL LOW (ref 3.87–5.11)
RDW: 13.7 % (ref 11.5–15.5)
WBC: 9.8 10*3/uL (ref 4.0–10.5)

## 2015-02-18 LAB — ABO/RH: ABO/RH(D): O POS

## 2015-02-18 NOTE — Progress Notes (Signed)
Pt denies any problems or concerns at this time.  

## 2015-02-19 LAB — RPR: RPR Ser Ql: NONREACTIVE

## 2015-02-20 NOTE — H&P (Signed)
Natalie AbleKatie S Knapp is a 30 y.o. female presenting for Primary Low Transverse Cesarean Section for Twins with malpresentation of baby A being Breech,eliminating option of labor.Prenatal course uneventful, other than UDs's pos for codeine in first trimester. NST's have been reactive. Natalie Knapp Kitchen. History OB History    Gravida Para Term Preterm AB TAB SAB Ectopic Multiple Living   1              Past Medical History  Diagnosis Date  . Medical history non-contributory   . GERD (gastroesophageal reflux disease)    Past Surgical History  Procedure Laterality Date  . No past surgeries     Family History: family history includes Arthritis in her mother; Cancer in her father and paternal grandfather; Dementia in her paternal grandmother; Diabetes in her maternal grandfather and paternal grandmother; Heart disease in her father, maternal grandfather, maternal grandmother, and paternal grandmother; Hypertension in her maternal grandmother; Scoliosis in her mother; Stroke in her maternal grandmother; Thyroid disease in her maternal grandmother. Social History:  reports that she has been smoking Cigarettes.  She has been smoking about 0.25 packs per day. She has never used smokeless tobacco. She reports that she does not drink alcohol or use illicit drugs.   Prenatal Transfer Tool  Maternal Diabetes: No Genetic Screening: Normal Maternal Ultrasounds/Referrals: Normal Fetal Ultrasounds or other Referrals:  None Maternal Substance Abuse:  Yes:  Type: Other: unexplained opiates in first trimester. Significant Maternal Medications:  None Significant Maternal Lab Results:  None Other Comments:  None  ROS    Last menstrual period 06/07/2014. Exam Physical Exam  Constitutional: She is oriented to person, place, and time. She appears well-developed and well-nourished.  HENT:  Head: Normocephalic.  Eyes: EOM are normal. Pupils are equal, round, and reactive to light.  Neck: Normal range of motion.   Cardiovascular: Normal rate.   Respiratory: Effort normal.  GI: Soft.  Gravid uterus , twins consistent with dates, breech baby A by u/s at last visit  Genitourinary: Vagina normal.  Musculoskeletal: Normal range of motion.  Neurological: She is alert and oriented to person, place, and time. She has normal reflexes.  Psychiatric: She has a normal mood and affect. Her behavior is normal. Judgment and thought content normal.    Prenatal labs: CBC    Component Value Date/Time   WBC 9.8 02/18/2015 1355   WBC 13.8* 12/20/2014 0859   RBC 3.64* 02/18/2015 1355   RBC 3.58* 12/20/2014 0859   HGB 10.3* 02/18/2015 1355   HCT 31.0* 02/18/2015 1355   HCT 32.9* 12/20/2014 0859   PLT 389 02/18/2015 1355   PLT 396* 12/20/2014 0859   MCV 85.2 02/18/2015 1355   MCV 92 12/20/2014 0859   MCH 28.3 02/18/2015 1355   MCH 31.0 12/20/2014 0859   MCHC 33.2 02/18/2015 1355   MCHC 33.7 12/20/2014 0859   RDW 13.7 02/18/2015 1355   RDW 13.3 12/20/2014 0859     ABO, Rh: --/--/O POS, O POS (01/06 1355) Antibody: NEG (01/06 1355) Rubella: 1.22 (06/29 0952) RPR: Non Reactive (01/06 1355)  HBsAg: Negative (06/29 0952)  HIV: Non Reactive (11/07 0859)  GBS:   not done, primary c/s  Assessment/Plan: pregnancy  37 wk twin pregnancy Breech baby A, not for trial of labor.  For Cesarean section 02/21/15 at 9 " am  Jabes Primo V 02/20/2015, 11:31 PM

## 2015-02-21 ENCOUNTER — Inpatient Hospital Stay (HOSPITAL_COMMUNITY)
Admission: RE | Admit: 2015-02-21 | Discharge: 2015-02-23 | DRG: 765 | Disposition: A | Discharge status: Home / Self Care | Payer: Medicaid Other | Source: Ambulatory Visit | Attending: Family Medicine | Admitting: Family Medicine

## 2015-02-21 ENCOUNTER — Encounter (HOSPITAL_COMMUNITY): Payer: Self-pay | Admitting: Emergency Medicine

## 2015-02-21 ENCOUNTER — Inpatient Hospital Stay (HOSPITAL_COMMUNITY): Payer: Medicaid Other | Admitting: Anesthesiology

## 2015-02-21 ENCOUNTER — Encounter (HOSPITAL_COMMUNITY)
Admission: RE | Disposition: A | Discharge status: Home / Self Care | Payer: Self-pay | Source: Ambulatory Visit | Attending: Family Medicine

## 2015-02-21 DIAGNOSIS — O99214 Obesity complicating childbirth: Secondary | ICD-10-CM | POA: Diagnosis present

## 2015-02-21 DIAGNOSIS — E669 Obesity, unspecified: Secondary | ICD-10-CM | POA: Diagnosis present

## 2015-02-21 DIAGNOSIS — O99334 Smoking (tobacco) complicating childbirth: Secondary | ICD-10-CM

## 2015-02-21 DIAGNOSIS — F1190 Opioid use, unspecified, uncomplicated: Secondary | ICD-10-CM | POA: Diagnosis present

## 2015-02-21 DIAGNOSIS — O30003 Twin pregnancy, unspecified number of placenta and unspecified number of amniotic sacs, third trimester: Secondary | ICD-10-CM | POA: Diagnosis not present

## 2015-02-21 DIAGNOSIS — K219 Gastro-esophageal reflux disease without esophagitis: Secondary | ICD-10-CM

## 2015-02-21 DIAGNOSIS — Z6833 Body mass index (BMI) 33.0-33.9, adult: Secondary | ICD-10-CM

## 2015-02-21 DIAGNOSIS — M549 Dorsalgia, unspecified: Secondary | ICD-10-CM | POA: Diagnosis present

## 2015-02-21 DIAGNOSIS — O9962 Diseases of the digestive system complicating childbirth: Secondary | ICD-10-CM | POA: Diagnosis present

## 2015-02-21 DIAGNOSIS — F1290 Cannabis use, unspecified, uncomplicated: Secondary | ICD-10-CM | POA: Diagnosis present

## 2015-02-21 DIAGNOSIS — O30049 Twin pregnancy, dichorionic/diamniotic, unspecified trimester: Secondary | ICD-10-CM | POA: Diagnosis present

## 2015-02-21 DIAGNOSIS — F17210 Nicotine dependence, cigarettes, uncomplicated: Secondary | ICD-10-CM | POA: Diagnosis present

## 2015-02-21 DIAGNOSIS — O321XX0 Maternal care for breech presentation, not applicable or unspecified: Secondary | ICD-10-CM | POA: Diagnosis not present

## 2015-02-21 DIAGNOSIS — Z3A37 37 weeks gestation of pregnancy: Secondary | ICD-10-CM

## 2015-02-21 DIAGNOSIS — Z98891 History of uterine scar from previous surgery: Secondary | ICD-10-CM

## 2015-02-21 SURGERY — Surgical Case
Anesthesia: Spinal

## 2015-02-21 MED ORDER — KETOROLAC TROMETHAMINE 30 MG/ML IJ SOLN
30.0000 mg | Freq: Four times a day (QID) | INTRAMUSCULAR | Status: AC | PRN
  Administered 2015-02-21: 30 mg via INTRAMUSCULAR

## 2015-02-21 MED ORDER — LANOLIN HYDROUS EX OINT: 1.0000 "application " | TOPICAL_OINTMENT | CUTANEOUS | Status: DC | PRN

## 2015-02-21 MED ORDER — ONDANSETRON HCL 4 MG/2ML IJ SOLN: 4.0000 mg | Freq: Three times a day (TID) | INTRAMUSCULAR | Status: DC | PRN

## 2015-02-21 MED ORDER — DIPHENHYDRAMINE HCL 50 MG/ML IJ SOLN: 12.5000 mg | INTRAMUSCULAR | Status: DC | PRN

## 2015-02-21 MED ORDER — OXYTOCIN 10 UNIT/ML IJ SOLN
INTRAMUSCULAR | Status: AC
  Filled 2015-02-21: qty 4

## 2015-02-21 MED ORDER — OXYTOCIN 10 UNIT/ML IJ SOLN: 2.5000 [IU]/h | INTRAMUSCULAR | Status: AC

## 2015-02-21 MED ORDER — ONDANSETRON HCL 4 MG/2ML IJ SOLN
INTRAMUSCULAR | Status: DC | PRN
  Administered 2015-02-21: 4 mg via INTRAVENOUS

## 2015-02-21 MED ORDER — MORPHINE SULFATE (PF) 0.5 MG/ML IJ SOLN
INTRAMUSCULAR | Status: AC
  Filled 2015-02-21: qty 10

## 2015-02-21 MED ORDER — NALBUPHINE HCL 10 MG/ML IJ SOLN
5.0000 mg | INTRAMUSCULAR | Status: DC | PRN
Start: 2015-02-21 — End: 2015-02-23

## 2015-02-21 MED ORDER — KETOROLAC TROMETHAMINE 30 MG/ML IJ SOLN
INTRAMUSCULAR | Status: AC
  Filled 2015-02-21: qty 1

## 2015-02-21 MED ORDER — PROMETHAZINE HCL 25 MG/ML IJ SOLN: 6.2500 mg | INTRAMUSCULAR | Status: DC | PRN

## 2015-02-21 MED ORDER — CEFAZOLIN SODIUM-DEXTROSE 2-3 GM-% IV SOLR
2.0000 g | INTRAVENOUS | Status: AC
  Administered 2015-02-21: 2 g via INTRAVENOUS

## 2015-02-21 MED ORDER — SCOPOLAMINE 1 MG/3DAYS TD PT72: 1.0000 | MEDICATED_PATCH | Freq: Once | TRANSDERMAL | Status: DC

## 2015-02-21 MED ORDER — NALBUPHINE HCL 10 MG/ML IJ SOLN: 5.0000 mg | INTRAMUSCULAR | Status: DC | PRN

## 2015-02-21 MED ORDER — MENTHOL 3 MG MT LOZG: 1.0000 | LOZENGE | OROMUCOSAL | Status: DC | PRN

## 2015-02-21 MED ORDER — KETOROLAC TROMETHAMINE 30 MG/ML IJ SOLN: 30.0000 mg | Freq: Four times a day (QID) | INTRAMUSCULAR | Status: AC | PRN

## 2015-02-21 MED ORDER — KETOROLAC TROMETHAMINE 30 MG/ML IJ SOLN: 30.0000 mg | Freq: Four times a day (QID) | INTRAMUSCULAR | Status: DC

## 2015-02-21 MED ORDER — IBUPROFEN 600 MG PO TABS
600.0000 mg | ORAL_TABLET | Freq: Four times a day (QID) | ORAL | Status: DC
  Administered 2015-02-21 – 2015-02-23 (×8): 600 mg via ORAL
  Filled 2015-02-21 (×8): qty 1

## 2015-02-21 MED ORDER — SIMETHICONE 80 MG PO CHEW
80.0000 mg | CHEWABLE_TABLET | Freq: Three times a day (TID) | ORAL | Status: DC
  Administered 2015-02-21 – 2015-02-23 (×6): 80 mg via ORAL
  Filled 2015-02-21 (×6): qty 1

## 2015-02-21 MED ORDER — CEFAZOLIN SODIUM-DEXTROSE 2-3 GM-% IV SOLR
INTRAVENOUS | Status: AC
  Filled 2015-02-21: qty 50

## 2015-02-21 MED ORDER — SIMETHICONE 80 MG PO CHEW
80.0000 mg | CHEWABLE_TABLET | ORAL | Status: DC
  Administered 2015-02-22 (×2): 80 mg via ORAL
  Filled 2015-02-21 (×2): qty 1

## 2015-02-21 MED ORDER — SCOPOLAMINE 1 MG/3DAYS TD PT72
MEDICATED_PATCH | TRANSDERMAL | Status: AC
  Administered 2015-02-21: 1.5 mg via TRANSDERMAL
  Filled 2015-02-21: qty 1

## 2015-02-21 MED ORDER — PANTOPRAZOLE SODIUM 40 MG PO TBEC
40.0000 mg | DELAYED_RELEASE_TABLET | Freq: Every day | ORAL | Status: DC
Start: 2015-02-21 — End: 2015-02-23
  Administered 2015-02-22 – 2015-02-23 (×2): 40 mg via ORAL
  Filled 2015-02-21 (×2): qty 1

## 2015-02-21 MED ORDER — ONDANSETRON HCL 4 MG/2ML IJ SOLN
INTRAMUSCULAR | Status: AC
  Filled 2015-02-21: qty 2

## 2015-02-21 MED ORDER — LACTATED RINGERS IV SOLN
INTRAVENOUS | Status: DC
  Administered 2015-02-21 (×2): via INTRAVENOUS

## 2015-02-21 MED ORDER — SIMETHICONE 80 MG PO CHEW: 80.0000 mg | CHEWABLE_TABLET | ORAL | Status: DC | PRN

## 2015-02-21 MED ORDER — SENNOSIDES-DOCUSATE SODIUM 8.6-50 MG PO TABS
2.0000 | ORAL_TABLET | ORAL | Status: DC
  Administered 2015-02-22 (×2): 2 via ORAL
  Filled 2015-02-21 (×2): qty 2

## 2015-02-21 MED ORDER — NALBUPHINE HCL 10 MG/ML IJ SOLN: 5.0000 mg | Freq: Once | INTRAMUSCULAR | Status: DC | PRN

## 2015-02-21 MED ORDER — ESOMEPRAZOLE MAGNESIUM 20 MG PO PACK: 20.0000 mg | PACK | Freq: Every day | ORAL | Status: DC

## 2015-02-21 MED ORDER — LACTATED RINGERS IV SOLN
Freq: Once | INTRAVENOUS | Status: AC
  Administered 2015-02-21: 08:00:00 via INTRAVENOUS

## 2015-02-21 MED ORDER — WITCH HAZEL-GLYCERIN EX PADS: 1.0000 "application " | MEDICATED_PAD | CUTANEOUS | Status: DC | PRN

## 2015-02-21 MED ORDER — DIPHENHYDRAMINE HCL 25 MG PO CAPS: 25.0000 mg | ORAL_CAPSULE | Freq: Four times a day (QID) | ORAL | Status: DC | PRN

## 2015-02-21 MED ORDER — ACETAMINOPHEN 325 MG PO TABS
650.0000 mg | ORAL_TABLET | ORAL | Status: DC | PRN
  Administered 2015-02-22 – 2015-02-23 (×2): 650 mg via ORAL
  Filled 2015-02-21 (×2): qty 2

## 2015-02-21 MED ORDER — PHENYLEPHRINE 8 MG IN D5W 100 ML (0.08MG/ML) PREMIX OPTIME
INJECTION | INTRAVENOUS | Status: DC | PRN
  Administered 2015-02-21: 60 ug/min via INTRAVENOUS

## 2015-02-21 MED ORDER — PHENYLEPHRINE 8 MG IN D5W 100 ML (0.08MG/ML) PREMIX OPTIME
INJECTION | INTRAVENOUS | Status: AC
  Filled 2015-02-21: qty 100

## 2015-02-21 MED ORDER — LACTATED RINGERS IV SOLN
INTRAVENOUS | Status: DC
  Administered 2015-02-21: 125 mL/h via INTRAVENOUS
  Administered 2015-02-22: 03:00:00 via INTRAVENOUS

## 2015-02-21 MED ORDER — ZOLPIDEM TARTRATE 5 MG PO TABS: 5.0000 mg | ORAL_TABLET | Freq: Every evening | ORAL | Status: DC | PRN

## 2015-02-21 MED ORDER — PRENATAL MULTIVITAMIN CH
1.0000 | ORAL_TABLET | Freq: Every day | ORAL | Status: DC
  Administered 2015-02-21: 1 via ORAL
  Filled 2015-02-21: qty 1

## 2015-02-21 MED ORDER — GABAPENTIN 400 MG PO CAPS
400.0000 mg | ORAL_CAPSULE | Freq: Three times a day (TID) | ORAL | Status: DC
  Filled 2015-02-21 (×3): qty 1

## 2015-02-21 MED ORDER — FENTANYL CITRATE (PF) 100 MCG/2ML IJ SOLN
INTRAMUSCULAR | Status: AC
  Filled 2015-02-21: qty 2

## 2015-02-21 MED ORDER — MEPERIDINE HCL 25 MG/ML IJ SOLN: 6.2500 mg | INTRAMUSCULAR | Status: DC | PRN

## 2015-02-21 MED ORDER — NALOXONE HCL 0.4 MG/ML IJ SOLN: 0.4000 mg | INTRAMUSCULAR | Status: DC | PRN

## 2015-02-21 MED ORDER — DIPHENHYDRAMINE HCL 25 MG PO CAPS: 25.0000 mg | ORAL_CAPSULE | ORAL | Status: DC | PRN

## 2015-02-21 MED ORDER — TRAMADOL HCL 50 MG PO TABS
100.0000 mg | ORAL_TABLET | Freq: Four times a day (QID) | ORAL | Status: AC
Start: 2015-02-21 — End: 2015-02-22
  Administered 2015-02-21 – 2015-02-22 (×4): 100 mg via ORAL
  Filled 2015-02-21 (×4): qty 2

## 2015-02-21 MED ORDER — NALOXONE HCL 2 MG/2ML IJ SOSY
1.0000 ug/kg/h | PREFILLED_SYRINGE | INTRAVENOUS | Status: DC | PRN
  Filled 2015-02-21: qty 2

## 2015-02-21 MED ORDER — OXYTOCIN 10 UNIT/ML IJ SOLN
40.0000 [IU] | INTRAMUSCULAR | Status: DC | PRN
  Administered 2015-02-21: 40 [IU] via INTRAVENOUS

## 2015-02-21 MED ORDER — SCOPOLAMINE 1 MG/3DAYS TD PT72
1.0000 | MEDICATED_PATCH | Freq: Once | TRANSDERMAL | Status: DC
  Administered 2015-02-21: 1.5 mg via TRANSDERMAL

## 2015-02-21 MED ORDER — MORPHINE SULFATE (PF) 0.5 MG/ML IJ SOLN
INTRAMUSCULAR | Status: DC | PRN
  Administered 2015-02-21: .2 mg via INTRATHECAL

## 2015-02-21 MED ORDER — BUPIVACAINE IN DEXTROSE 0.75-8.25 % IT SOLN
INTRATHECAL | Status: DC | PRN
  Administered 2015-02-21: 10.5 mg via INTRATHECAL

## 2015-02-21 MED ORDER — ACETAMINOPHEN 500 MG PO TABS
1000.0000 mg | ORAL_TABLET | Freq: Four times a day (QID) | ORAL | Status: AC
  Administered 2015-02-21 – 2015-02-22 (×3): 1000 mg via ORAL
  Filled 2015-02-21 (×4): qty 2

## 2015-02-21 MED ORDER — FENTANYL CITRATE (PF) 100 MCG/2ML IJ SOLN
INTRAMUSCULAR | Status: DC | PRN
  Administered 2015-02-21: 20 ug via INTRATHECAL

## 2015-02-21 MED ORDER — IBUPROFEN 600 MG PO TABS: 600.0000 mg | ORAL_TABLET | Freq: Four times a day (QID) | ORAL | Status: DC | PRN

## 2015-02-21 MED ORDER — DIBUCAINE 1 % RE OINT: 1.0000 "application " | TOPICAL_OINTMENT | RECTAL | Status: DC | PRN

## 2015-02-21 MED ORDER — SODIUM CHLORIDE 0.9 % IJ SOLN: 3.0000 mL | INTRAMUSCULAR | Status: DC | PRN

## 2015-02-21 SURGICAL SUPPLY — 36 items
BENZOIN TINCTURE PRP APPL 2/3 (GAUZE/BANDAGES/DRESSINGS) ×4 IMPLANT
CLAMP CORD UMBIL (MISCELLANEOUS) IMPLANT
CLOSURE WOUND 1/2 X4 (GAUZE/BANDAGES/DRESSINGS) ×1
CLOTH BEACON ORANGE TIMEOUT ST (SAFETY) ×4 IMPLANT
DRAPE SHEET LG 3/4 BI-LAMINATE (DRAPES) IMPLANT
DRSG OPSITE POSTOP 4X10 (GAUZE/BANDAGES/DRESSINGS) ×4 IMPLANT
DURAPREP 26ML APPLICATOR (WOUND CARE) ×4 IMPLANT
ELECT REM PT RETURN 9FT ADLT (ELECTROSURGICAL) ×4
ELECTRODE REM PT RTRN 9FT ADLT (ELECTROSURGICAL) ×2 IMPLANT
EXTRACTOR VACUUM KIWI (MISCELLANEOUS) IMPLANT
GLOVE BIO SURGEON ST LM GN SZ9 (GLOVE) ×4 IMPLANT
GLOVE BIOGEL PI IND STRL 7.0 (GLOVE) ×2 IMPLANT
GLOVE BIOGEL PI IND STRL 9 (GLOVE) ×2 IMPLANT
GLOVE BIOGEL PI INDICATOR 7.0 (GLOVE) ×2
GLOVE BIOGEL PI INDICATOR 9 (GLOVE) ×2
GOWN STRL REUS W/TWL 2XL LVL3 (GOWN DISPOSABLE) ×4 IMPLANT
GOWN STRL REUS W/TWL LRG LVL3 (GOWN DISPOSABLE) ×4 IMPLANT
NEEDLE HYPO 25X5/8 SAFETYGLIDE (NEEDLE) IMPLANT
NS IRRIG 1000ML POUR BTL (IV SOLUTION) ×4 IMPLANT
PACK C SECTION WH (CUSTOM PROCEDURE TRAY) ×4 IMPLANT
PAD OB MATERNITY 4.3X12.25 (PERSONAL CARE ITEMS) ×4 IMPLANT
RTRCTR C-SECT PINK 25CM LRG (MISCELLANEOUS) IMPLANT
RTRCTR C-SECT PINK 34CM XLRG (MISCELLANEOUS) IMPLANT
STRIP CLOSURE SKIN 1/2X4 (GAUZE/BANDAGES/DRESSINGS) ×3 IMPLANT
SUT MNCRL 0 VIOLET CTX 36 (SUTURE) ×4 IMPLANT
SUT MONOCRYL 0 CTX 36 (SUTURE) ×4
SUT PLAIN 2 0 (SUTURE) ×2
SUT PLAIN ABS 2-0 CT1 27XMFL (SUTURE) ×2 IMPLANT
SUT VIC AB 0 CT1 27 (SUTURE) ×2
SUT VIC AB 0 CT1 27XBRD ANBCTR (SUTURE) ×2 IMPLANT
SUT VIC AB 2-0 CT1 27 (SUTURE) ×2
SUT VIC AB 2-0 CT1 TAPERPNT 27 (SUTURE) ×2 IMPLANT
SUT VIC AB 4-0 KS 27 (SUTURE) ×4 IMPLANT
SYR BULB IRRIGATION 50ML (SYRINGE) IMPLANT
TOWEL OR 17X24 6PK STRL BLUE (TOWEL DISPOSABLE) ×4 IMPLANT
TRAY FOLEY CATH SILVER 14FR (SET/KITS/TRAYS/PACK) ×4 IMPLANT

## 2015-02-21 NOTE — Op Note (Signed)
02/21/2015  10:34 AM  PATIENT:  Natalie Knapp  30 y.o. female  PRE-OPERATIVE DIAGNOSIS:  TWIN PREGNANCY 37 WKS, BREECH BABY A  POST-OPERATIVE DIAGNOSIS:  TWIN PREGNANCY 37 WKS, BREECH BABY A  PROCEDURE:  Procedure(s) with comments: CESAREAN SECTION (N/A) - PRIMARY  SURGEON:  Surgeon(s) and Role:    * Tilda BurrowJohn Ferguson V, MD - Primary    * Federico FlakeKimberly Niles Miron Marxen, MD - Fellow  PHYSICIAN ASSISTANT:   ASSISTANTS: none   ANESTHESIA:   spinal  EBL:  Total I/O In: 2400 [I.V.:2400] Out: 750 [Urine:150; Blood:600]  BLOOD ADMINISTERED:none  DRAINS: none   LOCAL MEDICATIONS USED:  NONE  SPECIMEN:  Source of Specimen:  Placenta  DISPOSITION OF SPECIMEN:  PATHOLOGY   INDICATIONS: Natalie Knapp is a 30 y.o. G1P0 at 398w0d here for cesarean section secondary to the indications listed under preoperative diagnoses; please see preoperative note for further details.  The risks of cesarean section were discussed with the patient including but were not limited to: bleeding which may require transfusion or reoperation; infection which may require antibiotics; injury to bowel, bladder, ureters or other surrounding organs; injury to the fetus; need for additional procedures including hysterectomy in the event of a life-threatening hemorrhage; placental abnormalities wth subsequent pregnancies, incisional problems, thromboembolic phenomenon and other postoperative/anesthesia complications.   The patient concurred with the proposed plan, giving informed written consent for the procedure.    FINDINGS:  Twin A: Viable female infant in WinderFrank Breech presentation.  Apgars 8 and 9.  Clear amniotic fluid.  Twin B: viable female, vertex presentation. APGARS8, 9.  Clear amniotic fluid Intact placenta, three vessel cordx2.  Normal uterus, fallopian tubes and ovaries bilaterally.   PROCEDURE IN DETAIL:  The patient preoperatively received intravenous antibiotics and had sequential compression devices applied to  her lower extremities.  She was then taken to the operating room where spinal anesthesia was administered and  was found to be adequate. She was then placed in a dorsal supine position with a leftward tilt, and prepped and draped in a sterile manner.  A foley catheter was placed into her bladder and attached to constant gravity.  After an adequate timeout was performed, a Pfannenstiel skin incision was made with scalpel and carried through to the underlying layer of fascia. The fascia was incised in the midline, and this incision was extended bilaterally using the Mayo scissors.  Kocher clamps were applied to the superior aspect of the fascial incision and the underlying rectus muscles were dissected off bluntly. A similar process was carried out on the inferior aspect of the fascial incision. The rectus muscles were separated in the midline bluntly and the peritoneum was entered bluntly. Attention was turned to the lower uterine segment where a low transverse hysterotomy was made with a scalpel and extended bilaterally bluntly.  The Twin A was successfully delivered, the cord was clamped and cut and the infant was handed over to awaiting neonatology team. Twin B amniotomy was performed and infant ws delivered successfully. Uterine massage was then administered, and the placenta delivered intact with a three-vessel cord. The uterus was then cleared of clot and debris.  The hysterotomy was closed with 0 Monocryl in a running locked fashion, and an imbricating layer was also placed with 0 Monocryl..  The pelvis was cleared of all clot and debris and irrigated. Hemostasis was confirmed on all surfaces.  The peritoneum and the muscles were reapproximated using 0 Vicryl in a running fashion. The fascia was then closed  using 0 Vicryl in a running fashion.  The subcutaneous layer was irrigated, then reapproximated with 2-0 plain gut interrupted stitches.  The skin was closed with a 4-0 Vicryl subcuticular stitch. The  patient tolerated the procedure well. Sponge, lap, instrument and needle counts were correct x 2.  She was taken to the recovery room in stable condition.    Federico Flake, MD Family Medicine, OB Fellow [facility]

## 2015-02-21 NOTE — Transfer of Care (Signed)
Immediate Anesthesia Transfer of Care Note  Patient: Natalie AbleKatie S Knapp  Procedure(s) Performed: Procedure(s) with comments: CESAREAN SECTION (N/A) - PRIMARY  Patient Location: PACU  Anesthesia Type:Spinal  Level of Consciousness: awake  Airway & Oxygen Therapy: Patient Spontanous Breathing  Post-op Assessment: Report given to RN  Post vital signs: Reviewed and stable  Last Vitals:  Filed Vitals:   02/21/15 0813  BP: 124/88  Pulse: 103  Temp: 36.6 C  Resp: 18    Complications: No apparent anesthesia complications

## 2015-02-21 NOTE — Anesthesia Preprocedure Evaluation (Signed)
Anesthesia Evaluation  Patient identified by MRN, date of birth, ID band Patient awake    Reviewed: Allergy & Precautions, H&P , NPO status , Patient's Chart, lab work & pertinent test results  Airway Mallampati: I  TM Distance: >3 FB Neck ROM: full    Dental no notable dental hx.    Pulmonary Current Smoker,    Pulmonary exam normal        Cardiovascular negative cardio ROS Normal cardiovascular exam     Neuro/Psych negative neurological ROS  negative psych ROS   GI/Hepatic Neg liver ROS, GERD  Medicated and Controlled,  Endo/Other  negative endocrine ROS  Renal/GU negative Renal ROS     Musculoskeletal Back pain   Abdominal (+) + obese,   Peds  Hematology negative hematology ROS (+)   Anesthesia Other Findings   Reproductive/Obstetrics (+) Pregnancy                             Anesthesia Physical Anesthesia Plan  ASA: II  Anesthesia Plan: Spinal   Post-op Pain Management:    Induction:   Airway Management Planned:   Additional Equipment:   Intra-op Plan:   Post-operative Plan:   Informed Consent: I have reviewed the patients History and Physical, chart, labs and discussed the procedure including the risks, benefits and alternatives for the proposed anesthesia with the patient or authorized representative who has indicated his/her understanding and acceptance.     Plan Discussed with: CRNA and Surgeon  Anesthesia Plan Comments:         Anesthesia Quick Evaluation

## 2015-02-21 NOTE — Anesthesia Postprocedure Evaluation (Signed)
Anesthesia Post Note  Patient: Natalie AbleKatie S Knapp  Procedure(s) Performed: Procedure(s) (LRB): CESAREAN SECTION (N/A)  Patient location during evaluation: PACU Anesthesia Type: Spinal Level of consciousness: awake Pain management: pain level controlled Vital Signs Assessment: post-procedure vital signs reviewed and stable Respiratory status: spontaneous breathing Cardiovascular status: stable Postop Assessment: no headache, no backache, spinal receding, patient Knapp to bend at knees and no signs of nausea or vomiting Anesthetic complications: no    Last Vitals:  Filed Vitals:   02/21/15 1103 02/21/15 1115  BP:  115/80  Pulse: 75 73  Temp:    Resp: 20 18    Last Pain: There were no vitals filed for this visit.               Arlis Everly JR,JOHN Susann GivensFRANKLIN

## 2015-02-21 NOTE — Progress Notes (Signed)
Dr Alvester MorinNewton notified honeycomb 2/3 saturated.  Honeycomb replaced per Dr Ellenville BlasNewton's orders

## 2015-02-21 NOTE — Anesthesia Postprocedure Evaluation (Signed)
Anesthesia Post Note  Patient: Natalie Knapp  Procedure(s) Performed: Procedure(s) (LRB): CESAREAN SECTION (N/A)  Patient location during evaluation: Mother Baby Anesthesia Type: Spinal Level of consciousness: awake and alert Pain management: pain level controlled Vital Signs Assessment: post-procedure vital signs reviewed and stable Respiratory status: spontaneous breathing Cardiovascular status: blood pressure returned to baseline and stable Postop Assessment: no headache, no backache, spinal receding, patient able to bend at knees, no signs of nausea or vomiting and adequate PO intake Anesthetic complications: no    Last Vitals:  Filed Vitals:   02/21/15 1150 02/21/15 1245  BP: 118/55 111/76  Pulse: 74 61  Temp: 36.7 C 36.6 C  Resp: 18 15    Last Pain:  Filed Vitals:   02/21/15 1253  PainSc: 5                  [name]

## 2015-02-21 NOTE — Interval H&P Note (Signed)
History and Physical Interval Note:  02/21/2015 8:00 AM  Natalie AbleKatie S Duncan  has presented today for surgery, with the diagnosis of TWIN PREGNANCY 37 WKS, BREECH BABY A  The various methods of treatment have been discussed with the patient and family. After consideration of risks, benefits and other options for treatment, the patient has consented to  Procedure(s) with comments: CESAREAN SECTION (N/A) - PRIMARY as a surgical intervention .  The patient's history has been reviewed, patient examined, no change in status, stable for surgery.  I have reviewed the patient's chart and labs.  Questions were answered to the patient's satisfaction.     Tilda BurrowFERGUSON,Kowen Kluth V

## 2015-02-21 NOTE — Brief Op Note (Signed)
02/21/2015  10:34 AM  PATIENT:  Leilani AbleKatie S Duncan  30 y.o. female  PRE-OPERATIVE DIAGNOSIS:  TWIN PREGNANCY 37 WKS, BREECH BABY A  POST-OPERATIVE DIAGNOSIS:  TWIN PREGNANCY 37 WKS, BREECH BABY A  PROCEDURE:  Procedure(s) with comments: CESAREAN SECTION (N/A) - PRIMARY  SURGEON:  Surgeon(s) and Role:    * Tilda BurrowJohn Ferguson V, MD - Primary    * Federico FlakeKimberly Niles Pascha Fogal, MD - Fellow  PHYSICIAN ASSISTANT:   ASSISTANTS: none   ANESTHESIA:   spinal  EBL:  Total I/O In: 2400 [I.V.:2400] Out: 750 [Urine:150; Blood:600]  BLOOD ADMINISTERED:none  DRAINS: none   LOCAL MEDICATIONS USED:  NONE  SPECIMEN:  Source of Specimen:  Placenta  DISPOSITION OF SPECIMEN:  PATHOLOGY  COUNTS:  YES  TOURNIQUET:  * No tourniquets in log *  DICTATION: .Note written in EPIC  PLAN OF CARE: Admit to inpatient   PATIENT DISPOSITION:  PACU - hemodynamically stable.   Delay start of Pharmacological VTE agent (>24hrs) due to surgical blood loss or risk of bleeding: yes

## 2015-02-21 NOTE — Addendum Note (Signed)
Addendum  created 02/21/15 1324 by Janeece Ageeynthia W Makylah Bossard, CRNA   Modules edited: Clinical Notes   Clinical Notes:  File: [id number]

## 2015-02-21 NOTE — Anesthesia Procedure Notes (Signed)
Spinal Patient location during procedure: OR Start time: 02/21/2015 9:29 AM End time: 02/21/2015 9:33 AM Staffing Anesthesiologist: Leilani AbleHATCHETT, Roann Merk Performed by: anesthesiologist  Preanesthetic Checklist Completed: patient identified, site marked, surgical consent, pre-op evaluation, timeout performed, IV checked, risks and benefits discussed and monitors and equipment checked Spinal Block Patient position: sitting Prep: site prepped and draped and DuraPrep Patient monitoring: heart rate, cardiac monitor, continuous pulse ox and blood pressure Approach: midline Location: L3-4 Injection technique: single-shot Needle Needle type: Pencan  Needle gauge: 24 G Needle length: 9 cm Needle insertion depth: 5 cm Assessment Sensory level: T4

## 2015-02-21 NOTE — Progress Notes (Signed)
UR chart review completed.  

## 2015-02-22 ENCOUNTER — Encounter (HOSPITAL_COMMUNITY): Payer: Self-pay | Admitting: Obstetrics and Gynecology

## 2015-02-22 LAB — CBC
HCT: 28.7 % — ABNORMAL LOW (ref 36.0–46.0)
HEMOGLOBIN: 9.5 g/dL — AB (ref 12.0–15.0)
MCH: 28.4 pg (ref 26.0–34.0)
MCHC: 33.1 g/dL (ref 30.0–36.0)
MCV: 85.9 fL (ref 78.0–100.0)
Platelets: 434 10*3/uL — ABNORMAL HIGH (ref 150–400)
RBC: 3.34 MIL/uL — AB (ref 3.87–5.11)
RDW: 14.1 % (ref 11.5–15.5)
WBC: 12.6 10*3/uL — ABNORMAL HIGH (ref 4.0–10.5)

## 2015-02-22 MED ORDER — TRAMADOL HCL 50 MG PO TABS
100.0000 mg | ORAL_TABLET | Freq: Four times a day (QID) | ORAL | Status: DC | PRN
  Administered 2015-02-22 – 2015-02-23 (×4): 100 mg via ORAL
  Filled 2015-02-22 (×4): qty 2

## 2015-02-22 NOTE — Lactation Note (Signed)
This note was copied from the chart of Natalie Knapp. Lactation Consultation Note  Patient Name: Natalie Alitza Knapp Today's Date: 02/22/2015 Reason for consult: Follow-up assessment  Assisted with positioning and latching Baby B, as she was cueing.  Baby latched easily in football hold.  Instructed Mom on how to support baby's head.  Baby nutritive on the breast, but after 25 minutes, came off rooting and fussy.  Baby A didn't latch, just nuzzled at the breast.  Both babies bottle fed 15 ml Neosure well by FOB.  Encouraged Mom to double pump, but she was falling asleep during the feedings.  Will attempt to pump after next feeding.  Instructed on importance of regular supplementation, and regular pumping.  To follow up in am.   Wojciech Willetts E 02/22/2015, 12:26 PM    

## 2015-02-22 NOTE — Lactation Note (Signed)
This note was copied from the chart of Natalie Knapp. Lactation Consultation Note  Patient Name: Natalie Knapp [id number]'s Date: 02/22/2015   Babies born at 37 wks, and are 1925 hrs old, twin A (4-10.3) 4% WL, twin B (4-12.0) 3% WL.   Both babies have been breast feeding often, cueing often.  Latch scores 8 for twin A, but twin B was recently given a 6.  4 bottle supplements for both of them since birth.  Both babies sleeping at present and Mom about to nap.  Encouraged Mom to pump both her breasts every 3 hrs (15-20 mins) after skin to skin, or breast feeding (10-15 min max), and to continue supplementing 10-20 ml Pediasure 22 cal by bottle.  Mom is in agreement with plan.  To follow up prn, and in am.    Natalie Knapp, Natalie Knapp E 02/22/2015, 11:22 AM

## 2015-02-22 NOTE — Progress Notes (Signed)
Post Partum Day 1 Subjective:  Natalie Knapp is a 30 y.o. G1P1002 2684w0d s/p pLTCS.  No acute events overnight.  Pt denies problems with ambulating, voiding or po intake.  She denies nausea or vomiting.  Pain is well controlled.  She has had flatus. She has not had bowel movement.  Lochia Small.  Plan for birth control is oral contraceptives (estrogen/progesterone).  Method of Feeding: Bottle  Objective: Blood pressure 99/74, pulse 63, temperature 97.8 F (36.6 C), temperature source Oral, resp. rate 18, height 5' 1.5" (1.562 m), weight 82.555 kg (182 lb), last menstrual period 06/07/2014, SpO2 98 %, unknown if currently breastfeeding.  Physical Exam:  General: alert, cooperative and no distress Lochia:normal flow Chest: CTAB Heart: RRR no m/r/g Abdomen: +BS, soft, nontender,  Uterine Fundus: firm, below umbilicus Incision: clean, dry, intact DVT Evaluation: No evidence of DVT seen on physical exam. Extremities: No edema   Recent Labs  02/22/15 0500  HGB 9.5*  HCT 28.7*    Assessment/Plan:  ASSESSMENT: Natalie Knapp is a 30 y.o. G1P1002 184w0d s/p pLTCS.  Continue routine PP care.  Plan for discharge tomorrow   LOS: 1 day   Jacquiline DoeCaleb Nailea Whitehorn 02/22/2015, 7:53 AM

## 2015-02-22 NOTE — Lactation Note (Signed)
This note was copied from the chart of Natalie BleakGirlA Valeria Knapp. Lactation Consultation Note New mom w/twins 37 0/7 weeks. Small babies. Stress importance of I&O. LPI information sheet given. LPI newborn behavior discussed, STS, cluster feeding, supply and demand. Reviewed supplementation d/t wt. W/22 cal. Similac. FOB at bedside and supportive. Mom up moving around in room caring for babies. Mom shown how to use DEBP & how to disassemble, clean, & reassemble parts. Mom knows to pump q3h for 15-20 min. Mom pumped for 15 min. W/drop of colostrum in flange. Gave mom #21 flanges dd/t small nipples. Breast are asymmetrical. Rt. Breast is much larger than Lt. Wide space between breast, approx.2 fingers wideth between breast. Breast tissues minimal in little round ball shape of lime. Has everted nipples. Cone shaped breast. Hand expression taught w/easy flow colostrum. Mom encouraged to feed baby 8-12 times/24 hours and with feeding cues. Referred to Baby and Me Book in Breastfeeding section Pg. 22-23 for position options and Proper latch demonstration. WH/LC brochure given w/resources, support groups and LC services. Mom has WIC.  Patient Name: Natalie Knapp Today's Date: 02/22/2015 Reason for consult: Initial assessment   Maternal Data Has patient been taught Hand Expression?: Yes Does the patient have breastfeeding experience prior to this delivery?: No  Feeding Feeding Type: Formula Nipple Type: Slow - flow Length of feed: 10 min  LATCH Score/Interventions Latch: Repeated attempts needed to sustain latch, nipple held in mouth throughout feeding, stimulation needed to elicit sucking reflex. Intervention(s): Teach feeding cues;Skin to skin;Waking techniques  Audible Swallowing: A few with stimulation Intervention(s): Skin to skin;Hand expression  Type of Nipple: Everted at rest and after stimulation  Comfort (Breast/Nipple): Filling, red/small blisters or bruises, mild/mod discomfort  Problem  noted: Mild/Moderate discomfort Interventions (Mild/moderate discomfort): Post-pump;Hand massage;Hand expression  Hold (Positioning): No assistance needed to correctly position infant at breast. Intervention(s): Skin to skin;Position options;Support Pillows;Breastfeeding basics reviewed  LATCH Score: 8  Lactation Tools Discussed/Used Tools: Pump;Flanges Flange Size: Other (comment) (#21) Breast pump type: Double-Electric Breast Pump WIC Program: Yes Pump Review: Setup, frequency, and cleaning;Milk Storage Initiated by:: Peri JeffersonL. Burl Tauzin RN Date initiated:: 02/22/15   Consult Status Consult Status: Follow-up Date: 02/22/15 Follow-up type: In-patient    Damien Cisar, Diamond NickelLAURA G 02/22/2015, 4:00 AM

## 2015-02-22 NOTE — Clinical Social Work Maternal (Signed)
CLINICAL SOCIAL WORK MATERNAL/CHILD NOTE  Patient Details  Name: Natalie Knapp MRN: [id number] Date of Birth: 06/24/1985  Date:  26-Aug-2015  Clinical Social Worker Initiating Note:  Loleta Books MSW, LCSW Date/ Time Initiated:  September 02, 2015/1440     Child's Name:  Natalie Knapp and Natalie Knapp   Legal Guardian:  Maurine Simmering and Brad  Need for Interpreter:  None   Date of Referral:  01/27/2016     Reason for Referral:  Current Substance Use/Substance Use During Pregnancy    Referral Source:  Central Nursery   Address:  [address], [place]   Phone number:  [phone]   Household Members:  Spouse, Parents   Natural Supports (not living in the home):  Immediate Family, Extended Family   Professional Supports: None   Employment: Homemaker   Type of Work:     Education:      Architect:  [employer]   Other Resources:  [employer]   Cultural/Religious Considerations Which May Impact Care: None reported  Strengths:  Ability to meet basic needs , Home prepared for child    Risk Factors/Current Problems:  Substance Use    Cognitive State:  Knapp to Concentrate , Alert , Linear Thinking , Goal Oriented    Mood/Affect:  Bright , Happy , Interested    CSW Assessment:  CSW received request for consult due to [name] presenting with a history of THC and opiate use during the pregnancy. Per chart review, [name] admitted to [facility] use during the pregnancy.  She also had +UDS for opiates on 08/11/14, 11/17/14, and 12/27 without a prescription.    [name] was alone in her room upon CSW arrival. She was in a pleasant mood, displayed a full range in affect, and was receptive to the visit.  She was observed to be moving around her room, and she stated that she was eager to begin ambulating as soon as possible after her C-section since she knows that it helps with recovery.  [name] endorsed feelings of happiness and excitement secondary to the twins' birth, and stated that she feels grateful that they  are healthy. [name] reported feeling content with her scheduled C-section, and stated that her only anxiety related to the C-section was prior to it beginning since she had never had a surgery before. [name] shared that she was hopeful for a positive outcome since she has a strong therapeutic relationship with her OB provider.  [name] stated that she had time to prepare for the C-section since she was informed that it would need to occur due to the position of the twins, and stated that she knew that delivery via C-section could always be an option. [name] did not indicate any feelings of loss associated with an inability to have a vaginal birth.    [name] reported feeling well supported by the FOB as she prepares to transition home. She stated that her mother has also moved into her home in order to help out more. [name] also identified her brother as an additional member of her support system.  Per [name], she is readily accepts help, and does not identify asking for and accepting help as negative.  She stated that she recognizes that it is normal to need help as she transitions and adjusts to motherhood.  [name] stated that she and the FOB moved approximately 3 weeks ago to their new apartment, and stated that they have been Knapp to successfully prepare the apartment for the twins.   [name] denied history  of mental health diagnses, and denied history of perinatal mood disorders during the pregnancy. She stated that she had a good friend experience postpartum depression and anxiety, and was Knapp to articulate the friend's symptoms that led to her diagnosis.  [name] sat down next to CSW as CSW provided education on common signs and symptoms, and was receptive to exploring [name]'s risk and protective factors. CSW and [name] continued to discuss non-pharmalogocial interventions to support her mental health postpartum. [name] acknowledged the hormonal component of the diagnosis, recognized that it frequently occurs outside of one's control, and agreed  to notify her medical provider if she notes onset of symptoms.   [name] reported daily use of THC since age 30.  She stated that her OB encouraged her to reduce and case use during the pregnancy, and [name] reported that it was difficult due to nausea and pain.  [name] stated that she was Knapp to reduce THC use after the 4th month, but reported that she continued to use "now and then". [name] reported last THC use occurred 2 weeks ago.   [name] verbalized understanding of the hospital drug screen policy, and was informed that the twins' UDS are negative.  CSW discussed probability of cord tissue being positive, and informed [name] of CPS referral if positive. [name] denied questions or concerns, and CSW continued to provide education on what to anticipate and expect if CPS becomes involved. [name] stated that she will welcome CPS into her home, and denied presence of additional safety concerns in her home.   [name] confirmed opiate use during the pregnancy. She stated that due to pregnancy related pain, she would occasionally take oxycodone and hydrocodone. [name] reported that it was infrequent, but was unable to clarify exact use.  Per [name], the opiates were from a friend and not prescribed to her. She stated that her OB also discussed his concerns about unprescribed opiate use during the pregnancy, and stated that she has not had any opiates in past 2 weeks.  [name] denied belief that opiate use is a current problem, and stated that she was not using opiates "to get high".  [name] denied any negative outcomes of unprescribed use in the past. She emphasized that it was due to needing her pain controlled, but she understood the concern from all providers about unprescribed use.     [name] expressed appreciation for the visit and support. She stated that she feels well supported by the hospital staff, and all questions have been answered thus far.  Per [name], she is agreeable to ongoing stay for the infants in order to ensure that they are healthy and  ready for discharge, and recognized that it is only a short term stay at the hospital. [name] stated that she initially was overwhelmed about caring for twins as she questioned her ability to parent them, but reported that she feels comfortable, confident, and prepared to go home.   CSW Plan/Description:   1. Patient/Family Education- Perinatal mood disorders, hospital drug screen policy 2. Infants' toxicology screens are negative. CSW to monitor cord tissue results, and will make a CPS report if positive.  3. No Further Intervention Required/No Barriers to Discharge    Kelby FamVenning, Aiesha Leland N, LCSW 02/22/2015, 3:16 PM

## 2015-02-23 MED ORDER — TRAMADOL HCL 50 MG PO TABS: 100.0000 mg | ORAL_TABLET | Freq: Four times a day (QID) | ORAL | Status: DC | PRN

## 2015-02-23 NOTE — Lactation Note (Signed)
This note was copied from the chart of Natalie Shawanda Knapp. Lactation Consultation Note  Follow up visit.  Mom states she continues to put babies to breast and also supplementing with formula.  She states she does not want to pump in addition because it's just not for her.  Recommended she continue to supplement babies every 3 hours due to small size.  Denies questions or concerns.  Patient Name: Natalie Knapp Today's Date: 02/23/2015     Maternal Data    Feeding Feeding Type: Bottle Fed - Formula Nipple Type: Slow - flow  LATCH Score/Interventions                      Lactation Tools Discussed/Used     Consult Status      Derry Arbogast S 02/23/2015, 2:22 PM    

## 2015-02-23 NOTE — Discharge Summary (Signed)
OB Discharge Summary  Patient Name: Natalie Knapp DOB: 09/09/1985 MRN: [id number]  Date of admission: 02/21/2015 Delivering MD:    Natalie Knapp [[id number]]  Natalie Knapp [[id number]]  Natalie Knapp   Date of discharge: 02/23/2015  Admitting diagnosis: TWIN PREGNANCY 37 WKS, BREECH BABY A Intrauterine pregnancy: [redacted]w[redacted]d     Secondary diagnosis:Active Problems:   S/P primary low transverse C-section  Additional problems:none     Discharge diagnosis: Term Pregnancy Delivered                                                                     Post partum procedures:none  Augmentation: NA  Complications: None  Hospital course:  Sceduled C/S   30 y.o. yo G1P1002 at [redacted]w[redacted]d was admitted to the hospital 02/21/2015 for scheduled cesarean section with the following indication:Malpresentation.  Membrane Rupture Time/Date:    Natalie Knapp [[id number]]  9:49 AM   Natalie Knapp [[id number]]  9:49 AM ,   Natalie Knapp [[id number]]  02/21/2015   Natalie Knapp [[id number]]  02/21/2015   Patient delivered a Viable infant.   Natalie Knapp [[id number]]  02/21/2015   Natalie Knapp [[id number]]  02/21/2015  Details of operation can be found in separate operative note.  Pateint had an uncomplicated postpartum course.  She is ambulating, tolerating a regular diet, passing flatus, and urinating well. Patient is discharged home in stable condition on  02/23/2015          Physical exam  Filed Vitals:   02/22/15 0551 02/22/15 1030 02/22/15 1821 02/23/15 0615  BP: 99/74 120/68 122/78 123/82  Pulse: 63 63 79 74  Temp: 97.8 F (36.6 C) 98.3 F (36.8 C) 98 F (36.7 C) 98 F (36.7 C)  TempSrc: Oral Oral Oral   Resp: 18  20 20   Height:      Weight:      SpO2:   98%    General: alert, cooperative and no distress Lochia: appropriate Uterine Fundus: firm Incision: Healing well with no significant drainage DVT Evaluation:  No evidence of DVT seen on physical exam. Negative Homan's sign. Labs: Lab Results  Component Value Date   WBC 12.6* 02/22/2015   HGB 9.5* 02/22/2015   HCT 28.7* 02/22/2015   MCV 85.9 02/22/2015   PLT 434* 02/22/2015   No flowsheet data found.  Discharge instruction: per After Visit Summary and "Baby and Me Booklet".  After Visit Meds:    Medication List    TAKE these medications        esomeprazole 20 MG packet  Commonly known as:  NEXIUM  Take 20 mg by mouth daily before breakfast.     PRENATAL VITAMINS PO  Take 1 tablet by mouth daily.     traMADol 50 MG tablet  Commonly known as:  ULTRAM  Take 2 tablets (100 mg total) by mouth every 6 (six) hours as needed for severe pain.        Diet: routine diet  Activity: Advance as tolerated. Pelvic rest for 6 weeks.   Outpatient follow up:6 weeks at [facility] No future appointments.  Postpartum contraception: Combination OCPs  Newborn Data:   Natalie Knapp [[id number]]  Live  born female  Birth Weight: 4 lb 13.4 oz (2195 g) APGAR: 8, 9   Natalie Knapp [[id number]][[id number]]  Live born female  Birth Weight: 4 lb 15.4 oz (2250 g) APGAR: 8, 9  Baby Feeding: Bottle and Breast Disposition:home with mother   02/23/2015 Natalie FlakeKimberly Niles Sreeja Spies, MD    OB fellow attestation I have seen and examined this patient and agree with above documentation in the resident's note.   Natalie Knapp is a 30 y.o. G1P1002 s/p NSVD.   Pain is well controlled.  Plan for birth control is oral contraceptives (estrogen/progesterone).  Method of Feeding: Breast/Bottle  PE:  BP 123/82 mmHg  Pulse 74  Temp(Src) 98 F (36.7 C) (Oral)  Resp 20  Ht 5' 1.5" (1.562 m)  Wt 182 lb (82.555 kg)  BMI 33.84 kg/m2  SpO2 98%  LMP 06/07/2014 (Exact Date)  Breastfeeding? Unknown Gen: well appearing Heart: reg rate Lungs: normal WOB Fundus firm Ext: soft, no pain, no edema   Recent Labs  02/22/15 0500  HGB 9.5*  HCT 28.7*   Plan:  discharge today - postpartum care discussed - f/u clinic in 6 weeks for postpartum visit   Natalie FlakeKimberly Niles Natalie Salamone, MD 9:04 AM

## 2015-02-23 NOTE — Discharge Instructions (Signed)
Cesarean Delivery, Care After  Refer to this sheet in the next few weeks. These instructions provide you with information on caring for yourself after your procedure. Your health care provider may also give you specific instructions. Your treatment has been planned according to current medical practices, but problems sometimes occur. Call your health care provider if you have any problems or questions after you go home.  HOME CARE INSTRUCTIONS   Only take over-the-counter or prescription medications as directed by your health care provider.   Do not drink alcohol, especially if you are breastfeeding or taking medication to relieve pain.   Do not chew or smoke tobacco.   Continue to use good perineal care. Good perineal care includes:    Wiping your perineum from front to back.    Keeping your perineum clean.   Check your surgical cut (incision) daily for increased redness, drainage, swelling, or separation of skin.   Clean your incision gently with soap and water every day, and then pat it dry. If your health care provider says it is okay, leave the incision uncovered. Use a bandage (dressing) if the incision is draining fluid or appears irritated. If the adhesive strips across the incision do not fall off within 7 days, carefully peel them off.   Hug a pillow when coughing or sneezing until your incision is healed. This helps to relieve pain.   Do not use tampons or douche until your health care provider says it is okay.   Shower, wash your hair, and take tub baths as directed by your health care provider.   Wear a well-fitting bra that provides breast support.   Limit wearing support panties or control-top hose.   Drink enough fluids to keep your urine clear or pale yellow.   Eat high-fiber foods such as whole grain cereals and breads, brown rice, beans, and fresh fruits and vegetables every day. These foods may help prevent or relieve constipation.   Resume activities such as climbing stairs,  driving, lifting, exercising, or traveling as directed by your health care provider.   Talk to your health care provider about resuming sexual activities. This is dependent upon your risk of infection, your rate of healing, and your comfort and desire to resume sexual activity.   Try to have someone help you with your household activities and your newborn for at least a few days after you leave the hospital.   Rest as much as possible. Try to rest or take a nap when your newborn is sleeping.   Increase your activities gradually.   Keep all of your scheduled postpartum appointments. It is very important to keep your scheduled follow-up appointments. At these appointments, your health care provider will be checking to make sure that you are healing physically and emotionally.  SEEK MEDICAL CARE IF:    You are passing large clots from your vagina. Save any clots to show your health care provider.   You have a foul smelling discharge from your vagina.   You have trouble urinating.   You are urinating frequently.   You have pain when you urinate.   You have a change in your bowel movements.   You have increasing redness, pain, or swelling near your incision.   You have pus draining from your incision.   Your incision is separating.   You have painful, hard, or reddened breasts.   You have a severe headache.   You have blurred vision or see spots.   You feel sad   or depressed.   You have thoughts of hurting yourself or your newborn.   You have questions about your care, the care of your newborn, or medications.   You are dizzy or light-headed.   You have a rash.   You have pain, redness, or swelling at the site of the removed intravenous access (IV) tube.   You have nausea or vomiting.   You stopped breastfeeding and have not had a menstrual period within 12 weeks of stopping.   You are not breastfeeding and have not had a menstrual period within 12 weeks of delivery.   You have a fever.  SEEK  IMMEDIATE MEDICAL CARE IF:   You have persistent pain.   You have chest pain.   You have shortness of breath.   You faint.   You have leg pain.   You have stomach pain.   Your vaginal bleeding saturates 2 or more sanitary pads in 1 hour.  MAKE SURE YOU:    Understand these instructions.   Will watch your condition.   Will get help right away if you are not doing well or get worse.     This information is not intended to replace advice given to you by your health care provider. Make sure you discuss any questions you have with your health care provider.     Document Released: 10/21/2001 Document Revised: 02/19/2014 Document Reviewed: 09/26/2011  Elsevier Interactive Patient Education 2016 [employer]

## 2015-02-24 ENCOUNTER — Ambulatory Visit: Payer: Self-pay

## 2015-02-24 NOTE — Lactation Note (Signed)
This note was copied from the chart of Natalie Knapp. Lactation Consultation Note Follow up at 71 hours of age.  Mom is anticipating discharge for twins this morning.   Twin A Has had 10 bottle feedings recorded with 2 breastfeedings and adequate output Twin B has had 7 bottle feedings recorded with 2 breastfeedings and adequate output.  Mom reports FOB may have more feedings to add, but is asleep right now.   Mom reports DEBP is hurting and she does not plan to use at home.  Encouraged mom to bring home supplies anyway and offered a hand pump.  Instructed mom on use of hand pump with good flange fit.  Discussed breast care after discharge with milk volume increasing and handling engorgement with ice and massage as needed.  Mom to soften breast tissue before latching baby when breast becomes full.  Discussed supply and demand with more frequent bottle feeding of formula and mom is aware how it may affect her milk supply.  Encouraged mom to continue feeding log to document feedings and output and discussed a minimum of 8 feedings in 24 hours and waking baby as needed.  Encouraged mom to slowly increase volume of bottle feedings as baby is tolerating.  Mom denies further questions at this time.  Mom is aware of o/p services related to lactation needs.      Patient Name: Natalie Knapp ZOXWR'UToday's Date: 02/24/2015 Reason for consult: Follow-up assessment;Multiple gestation;Infant < 6lbs   Maternal Data    Feeding    LATCH Score/Interventions                      Lactation Tools Discussed/Used Pump Review: Setup, frequency, and cleaning   Consult Status Consult Status: Complete    Natalie Knapp, Natalie Knapp 02/24/2015, 8:52 AM

## 2015-03-02 ENCOUNTER — Ambulatory Visit (INDEPENDENT_AMBULATORY_CARE_PROVIDER_SITE_OTHER): Payer: Medicaid Other | Admitting: Obstetrics and Gynecology

## 2015-03-02 ENCOUNTER — Encounter: Payer: Self-pay | Admitting: Obstetrics and Gynecology

## 2015-03-02 VITALS — BP 120/76 | Ht 61.5 in | Wt 159.0 lb

## 2015-03-02 DIAGNOSIS — Z9889 Other specified postprocedural states: Secondary | ICD-10-CM

## 2015-03-02 DIAGNOSIS — Z98891 History of uterine scar from previous surgery: Secondary | ICD-10-CM

## 2015-03-02 NOTE — Progress Notes (Signed)
Patient ID: Natalie Knapp, female   DOB: [birth year], 30 y.o.   MRN: [id number]    Subjective:  Natalie Knapp is a 30 y.o. female now 1 weeks status post cesarean.     Review of Systems Negative except    Diet:   reg   Bowel movements : normal.  The patient is not having any pain.  Objective:  BP 120/76 mmHg  Ht 5' 1.5" (1.562 m)  Wt 159 lb (72.122 kg)  BMI 29.56 kg/m2  LMP 06/07/2014 (Exact Date)  Breastfeeding? No General:Well developed, well nourished.  No acute distress. Abdomen: Bowel sounds normal, soft, non-tender. Pelvic Exam:    E Incision(s):   Healing well, no drainage, no erythema, no hernia, no swelling, no dehiscence,     Assessment:  Post-Op 1 weeks s/p cesarean     Doing well postoperatively.   Plan:  1.Wound care discussed   2. . current medications. 3. Activity restrictions: routine post cesarean 4. return to work: 1-2 weeks. 5. Follow up in 4 weeks.

## 2015-03-02 NOTE — Progress Notes (Signed)
Patient ID: Natalie Knapp, female   DOB: Sep 02, 1985, 30 y.o.   MRN: [id number] Pt here today for post op. Pt denies any problems or concerns at this time.

## 2015-03-30 ENCOUNTER — Encounter: Payer: Medicaid Other | Admitting: Obstetrics and Gynecology

## 2015-03-31 ENCOUNTER — Encounter: Payer: Medicaid Other | Admitting: Obstetrics and Gynecology

## 2015-04-07 ENCOUNTER — Ambulatory Visit: Payer: Medicaid Other | Admitting: Advanced Practice Midwife

## 2015-04-13 ENCOUNTER — Ambulatory Visit (INDEPENDENT_AMBULATORY_CARE_PROVIDER_SITE_OTHER): Payer: Medicaid Other | Admitting: Advanced Practice Midwife

## 2015-04-13 ENCOUNTER — Encounter: Payer: Self-pay | Admitting: Advanced Practice Midwife

## 2015-04-13 VITALS — BP 110/62 | HR 64 | Ht 61.0 in | Wt 164.0 lb

## 2015-04-13 DIAGNOSIS — Z3202 Encounter for pregnancy test, result negative: Secondary | ICD-10-CM

## 2015-04-13 LAB — POCT URINE PREGNANCY: Preg Test, Ur: NEGATIVE

## 2015-04-13 MED ORDER — NORGESTIMATE-ETH ESTRADIOL 0.25-35 MG-MCG PO TABS: 1.0000 | ORAL_TABLET | Freq: Every day | ORAL | Status: DC

## 2015-04-13 NOTE — Progress Notes (Signed)
  Natalie Knapp is a 30 y.o. who presents for a postpartum visit. She is 7 weeks postpartum following a low cervical transverse Cesarean section. I have fully reviewed the prenatal and intrapartum course. The delivery was at 37 gestational weeks for breech twins. She is doing great. Anesthesia: spinal. Postpartum course has been uncomplicated. Baby's course has been uneventful. Babies are feeding by bottle. Bleeding: started period last week. Bowel function is normal. Bladder function is normal. Patient is sexually active. Contraception method is condoms. Postpartum depression screening: negative.   Current outpatient prescriptions:  .  esomeprazole (NEXIUM) 20 MG capsule, Take 20 mg by mouth daily at 12 noon., Disp: , Rfl:   Review of Systems   Constitutional: Negative for fever and chills Eyes: Negative for visual disturbances Respiratory: Negative for shortness of breath, dyspnea Cardiovascular: Negative for chest pain or palpitations  Gastrointestinal: Negative for vomiting, diarrhea and constipation Genitourinary: Negative for dysuria and urgency Musculoskeletal: Negative for back pain, joint pain, myalgias  Neurological: Negative for dizziness and headaches   Objective:     Filed Vitals:   04/13/15 1031  BP: 110/62  Pulse: 64   General:  alert, cooperative and no distress   Breasts:  negative  Lungs: clear to auscultation bilaterally  Heart:  regular rate and rhythm  Abdomen: Soft, nontender, well healed   Vulva:  normal  Vagina: normal vagina  Cervix:  closed  Corpus: Well involuted     Rectal Exam: no hemorrhoids        Assessment:    normal postpartum exam.  Plan:    1. Contraception: OCP (estrogen/progesterone)rx Sprintec 2. Follow up in:   or as needed.

## 2015-06-23 ENCOUNTER — Telehealth: Payer: Self-pay | Admitting: Adult Health

## 2015-06-23 NOTE — Telephone Encounter (Signed)
Pt called stating that she would like a call back from Jennifer, Pt did not state the reason why. Please contact pt °

## 2015-06-23 NOTE — Telephone Encounter (Signed)
Called [phone] she is not at this number

## 2015-11-26 ENCOUNTER — Other Ambulatory Visit: Payer: Self-pay | Admitting: Advanced Practice Midwife

## 2016-03-09 ENCOUNTER — Other Ambulatory Visit: Payer: Self-pay | Admitting: Advanced Practice Midwife

## 2016-03-13 ENCOUNTER — Other Ambulatory Visit: Payer: Self-pay | Admitting: Advanced Practice Midwife

## 2016-06-14 ENCOUNTER — Encounter (HOSPITAL_COMMUNITY): Payer: Self-pay | Admitting: Obstetrics and Gynecology

## 2017-02-02 ENCOUNTER — Other Ambulatory Visit: Payer: Self-pay | Admitting: Advanced Practice Midwife

## 2017-08-13 ENCOUNTER — Ambulatory Visit (INDEPENDENT_AMBULATORY_CARE_PROVIDER_SITE_OTHER): Payer: Self-pay | Admitting: Advanced Practice Midwife

## 2017-08-13 ENCOUNTER — Encounter: Payer: Self-pay | Admitting: Advanced Practice Midwife

## 2017-08-13 VITALS — BP 106/67 | HR 90 | Ht 61.0 in | Wt 152.0 lb

## 2017-08-13 DIAGNOSIS — Z3041 Encounter for surveillance of contraceptive pills: Secondary | ICD-10-CM

## 2017-08-13 DIAGNOSIS — N920 Excessive and frequent menstruation with regular cycle: Secondary | ICD-10-CM

## 2017-08-13 MED ORDER — NORETHIN ACE-ETH ESTRAD-FE 1.5-30 MG-MCG PO TABS: 1.0000 | ORAL_TABLET | Freq: Every day | ORAL | 11 refills | Status: DC

## 2017-08-13 MED ORDER — NORETHIN-ETH ESTRAD-FE BIPHAS 1 MG-10 MCG / 10 MCG PO TABS: 1.0000 | ORAL_TABLET | Freq: Every day | ORAL | 11 refills | Status: DC

## 2017-08-13 NOTE — Progress Notes (Addendum)
[facility] Visit  Patient name: Natalie Knapp MRN [id number]  Date of birth: 09/01/1985  CC & HPI:  Natalie Knapp is a 32 y.o. Caucasian female presenting today for c/o still having very heavy periods despite being on sprintec.  Pays cash for pills ($25 at [facility]).    Pertinent History Reviewed:  Medical & Surgical Hx:   Past Medical History:  Diagnosis Date  . GERD (gastroesophageal reflux disease)   . Medical history non-contributory    Past Surgical History:  Procedure Laterality Date  . CESAREAN SECTION    . CESAREAN SECTION MULTI-GESTATIONAL  02/21/2015   Procedure: CESAREAN SECTION MULTI-GESTATIONAL;  Surgeon: Tilda BurrowJohn Ferguson V, MD;  Location: [facility];  Service: Obstetrics;;  . NO PAST SURGERIES     Family History  Problem Relation Age of Onset  . Scoliosis Mother   . Arthritis Mother   . Other Mother        blood clot  . Cancer Father        colon, lung, skin  . Heart disease Father   . Heart disease Maternal Grandmother   . Stroke Maternal Grandmother   . Thyroid disease Maternal Grandmother   . Hypertension Maternal Grandmother   . Diabetes Maternal Grandfather   . Heart disease Maternal Grandfather   . Heart disease Paternal Grandmother   . Diabetes Paternal Grandmother   . Dementia Paternal Grandmother   . Cancer Paternal Grandfather        melanoma    Current Outpatient Medications:  .  norgestimate-ethinyl estradiol (ORTHO-CYCLEN,SPRINTEC,PREVIFEM) 0.25-35 MG-MCG tablet, TAKE 1 TABLET DAILY, Disp: 28 tablet, Rfl: 11 .  NEXIUM 20 MG capsule, TAKE 20 MG BY MOUTH DAILY BEFORE BREAKFAST. (Patient not taking: Reported on 08/13/2017), Disp: 30 capsule, Rfl: 10 .  Norethindrone-Ethinyl Estradiol-Fe Biphas (LO LOESTRIN FE) 1 MG-10 MCG / 10 MCG tablet, Take 1 tablet by mouth daily., Disp: 1 Package, Rfl: 11 .  SPRINTEC 28 0.25-35 MG-MCG tablet, TAKE 1 TABLET DAILY (Patient not taking: Reported on 08/13/2017), Disp: 28 tablet, Rfl: 6 Social History:  Reviewed -  reports that she has been smoking cigarettes.  She has been smoking about 0.25 packs per day. She has never used smokeless tobacco.  Review of Systems:   Constitutional: Negative for fever and chills Eyes: Negative for visual disturbances Respiratory: Negative for shortness of breath, dyspnea Cardiovascular: Negative for chest pain or palpitations  Gastrointestinal: Negative for vomiting, diarrhea and constipation; no abdominal pain Genitourinary: Negative for dysuria and urgency, vaginal irritation or itching Musculoskeletal: Negative for back pain, joint pain, myalgias  Neurological: Negative for dizziness and headaches    Objective Findings:    Physical Examination: Vitals:   08/13/17 1035  BP: 106/67  Pulse: 90   General appearance - well appearing, and in no distress Mental status - alert, oriented to person, place, and time Chest:  Normal respiratory effort Heart - normal rate and regular rhythm Musculoskeletal:  Normal range of motion without pain Extremities:  No edema    No results found for this or any previous visit (from the past 24 hour(s)).    Assessment & Plan:  A:   Menorrhagia/dysmenorrhea on COCs P:  Try LoLoestrin (d/t great bleeding profile) w/coupon.  If still too expensive, GoodRx says [facility]   Return for If you have any problems. (can call)   Jacklyn ShellFrances Cresenzo-Dishmon CNM 08/13/2017 12:10 PM  2:17 PM Coupon didn't work. Microgestin 1.5/20 [facility] in

## 2017-08-13 NOTE — Addendum Note (Signed)
Addended by: Jacklyn ShellRESENZO-DISHMON, Jarrett [place] on: 08/13/2017 02:17 PM   Modules accepted: Orders

## 2018-07-17 ENCOUNTER — Other Ambulatory Visit: Payer: Self-pay | Admitting: Advanced Practice Midwife

## 2018-07-21 NOTE — Telephone Encounter (Signed)
Pt checking status on her refill. States she has been without pills for a week.

## 2018-07-22 NOTE — Telephone Encounter (Signed)
Patient is checking on the bc refill request.  She'd like to see if another provider can handle refilling this.  [place] [place]  [phone]

## 2018-07-23 ENCOUNTER — Other Ambulatory Visit: Payer: Self-pay | Admitting: Women's Health

## 2018-07-23 MED ORDER — NORETHIN ACE-ETH ESTRAD-FE 1.5-30 MG-MCG PO TABS: 1.0000 | ORAL_TABLET | Freq: Every day | ORAL | 1 refills | Status: DC

## 2018-07-23 NOTE — Telephone Encounter (Signed)
Pt checking on the refill of her birth control. States she has been out of medication for over a week.

## 2018-09-05 ENCOUNTER — Other Ambulatory Visit: Payer: Self-pay | Admitting: Adult Health

## 2018-09-12 ENCOUNTER — Telehealth: Payer: Self-pay | Admitting: Obstetrics and Gynecology

## 2018-09-12 NOTE — Telephone Encounter (Signed)

## 2018-09-15 ENCOUNTER — Other Ambulatory Visit: Payer: Self-pay | Admitting: Obstetrics and Gynecology

## 2018-09-19 ENCOUNTER — Other Ambulatory Visit: Payer: Self-pay | Admitting: Women's Health

## 2018-10-24 ENCOUNTER — Other Ambulatory Visit: Payer: Self-pay | Admitting: Women's Health

## 2018-12-02 ENCOUNTER — Other Ambulatory Visit: Payer: Self-pay | Admitting: Women's Health

## 2018-12-04 ENCOUNTER — Telehealth: Payer: Self-pay | Admitting: Obstetrics and Gynecology

## 2018-12-04 NOTE — Telephone Encounter (Signed)

## 2018-12-05 ENCOUNTER — Other Ambulatory Visit: Payer: Self-pay | Admitting: Obstetrics and Gynecology

## 2018-12-30 ENCOUNTER — Telehealth: Payer: Self-pay | Admitting: *Deleted

## 2018-12-30 NOTE — Telephone Encounter (Signed)
Pt left message requesting a call back. She has some questions and concerns about her birth control.

## 2018-12-30 NOTE — Telephone Encounter (Signed)
Pt is on [place] birth control. Pt has severe lower back pain to wear it's crippling when getting ready to start a new pack and when she is on period. Period only lasts a few days and it's lighter. Pt has been on this period for 1 year. Is this a normal side effect with this pill? Also, pt is requesting a refill on this med. Thanks!! [place]

## 2018-12-30 NOTE — Telephone Encounter (Signed)
Left message @ 4:36 pm. JSY

## 2018-12-31 NOTE — Telephone Encounter (Signed)
Left message advising pt to schedule an appt. [place]

## 2019-01-07 ENCOUNTER — Telehealth: Payer: Self-pay | Admitting: Advanced Practice Midwife

## 2019-01-07 NOTE — Telephone Encounter (Signed)

## 2019-01-09 ENCOUNTER — Other Ambulatory Visit: Payer: Self-pay | Admitting: Women's Health

## 2019-01-12 ENCOUNTER — Other Ambulatory Visit: Payer: Self-pay | Admitting: *Deleted

## 2019-01-12 ENCOUNTER — Telehealth: Payer: Self-pay | Admitting: *Deleted

## 2019-01-12 ENCOUNTER — Other Ambulatory Visit: Payer: Self-pay | Admitting: Advanced Practice Midwife

## 2019-01-12 MED ORDER — NORETHIN ACE-ETH ESTRAD-FE 1.5-30 MG-MCG PO TABS: 1.0000 | ORAL_TABLET | Freq: Every day | ORAL | 3 refills | Status: DC

## 2019-01-12 NOTE — Telephone Encounter (Signed)
Patient called stating had to cancel appointment due to being sick. She is rescheduled for her next appointment 02/19/2019. Patient would like two refills on BCP until her next appointment.

## 2019-02-18 ENCOUNTER — Telehealth: Payer: Self-pay | Admitting: Advanced Practice Midwife

## 2019-02-18 NOTE — Telephone Encounter (Signed)
Called patient regarding appointment scheduled in our office and advised to come alone to the visit, however, a support person, over age 33, may accompany her to appointment if assistance is needed for safety or care concerns. Otherwise, support persons should remain outside until the visit is complete.   Prescreen questions asked: 1. Any of the following symptoms of COVID such as chills, fever, cough, shortness of breath, muscle pain, diarrhea, rash, vomiting, abdominal pain, red eye, weakness, bruising, bleeding, joint pain, loss of taste or smell, a severe headache, sore throat, fatigue 2. Any exposure to anyone suspected or confirmed of having COVID-19 3. Awaiting test results for COVID-19  Also,to keep you safe, please use the provided hand sanitizer when you enter the office. We are asking everyone in the office to wear a mask to help prevent the spread of germs. If you have a mask of your own, please wear it to your appointment, if not, we are happy to provide one for you.  Thank you for understanding and your cooperation.    [facility] Staff      

## 2019-02-19 ENCOUNTER — Encounter: Payer: Self-pay | Admitting: Advanced Practice Midwife

## 2019-02-19 ENCOUNTER — Other Ambulatory Visit: Payer: Self-pay | Admitting: Advanced Practice Midwife

## 2019-02-26 ENCOUNTER — Other Ambulatory Visit: Payer: Self-pay

## 2019-02-26 ENCOUNTER — Ambulatory Visit (INDEPENDENT_AMBULATORY_CARE_PROVIDER_SITE_OTHER): Payer: Self-pay | Admitting: *Deleted

## 2019-02-26 VITALS — BP 127/88 | Ht 61.0 in

## 2019-02-26 DIAGNOSIS — Z3201 Encounter for pregnancy test, result positive: Secondary | ICD-10-CM

## 2019-02-26 LAB — POCT URINE PREGNANCY: Preg Test, Ur: POSITIVE — AB

## 2019-02-26 NOTE — Progress Notes (Signed)
Chart reviewed for nurse visit. Agree with plan of care.  Adline Potter, NP 02/26/2019 1:41 PM

## 2019-02-26 NOTE — Progress Notes (Signed)
   NURSE VISIT- PREGNANCY CONFIRMATION   SUBJECTIVE:  Natalie Knapp is a 34 y.o. G49P1002 female by uncertain LMP of Patient's last menstrual period was 01/05/2019 (approximate). Here for pregnancy confirmation.  Home pregnancy test: positive x 3  She reports no complaints.  She is not taking prenatal vitamins.    OBJECTIVE:  BP 127/88 (BP Location: Right Arm, Patient Position: Sitting, Cuff Size: Normal)   Ht 5\' 1"  (1.549 m)   LMP 01/05/2019 (Approximate)   BMI 28.72 kg/m   Appears well, in no apparent distress OB History  Gravida Para Term Preterm AB Living  2 1 1     2   SAB TAB Ectopic Multiple Live Births        1 2    # Outcome Date GA Lbr Len/2nd Weight Sex Delivery Anes PTL Lv  2 Current           1A Term 02/21/15 [redacted]w[redacted]d  4 lb 13.4 oz (2.195 kg) F CS-LTranv Spinal  LIV  1B Term 02/21/15 [redacted]w[redacted]d  4 lb 15.4 oz (2.25 kg) F CS-LTranv Spinal  LIV    Results for orders placed or performed in visit on 02/26/19 (from the past 24 hour(s))  POCT urine pregnancy   Collection Time: 02/26/19 10:14 AM  Result Value Ref Range   Preg Test, Ur Positive (A) Negative    ASSESSMENT: Positive pregnancy test, Unknown by LMP    PLAN: Schedule for dating ultrasound in 1-2 weeks Prenatal vitamins: plans to begin OTC ASAP   Nausea medicines: not currently needed   OB packet given: Yes  Natalie Knapp  02/26/2019 10:14 AM

## 2019-03-04 ENCOUNTER — Other Ambulatory Visit: Payer: Self-pay | Admitting: Obstetrics and Gynecology

## 2019-03-04 ENCOUNTER — Ambulatory Visit (INDEPENDENT_AMBULATORY_CARE_PROVIDER_SITE_OTHER): Payer: Self-pay

## 2019-03-04 ENCOUNTER — Other Ambulatory Visit: Payer: Self-pay

## 2019-03-04 ENCOUNTER — Telehealth: Payer: Self-pay | Admitting: Obstetrics & Gynecology

## 2019-03-04 DIAGNOSIS — Z3A09 9 weeks gestation of pregnancy: Secondary | ICD-10-CM

## 2019-03-04 DIAGNOSIS — O3680X0 Pregnancy with inconclusive fetal viability, not applicable or unspecified: Secondary | ICD-10-CM

## 2019-03-04 NOTE — Telephone Encounter (Signed)
Called patient regarding appointment and the following message was left: ° ° °We have you scheduled for an upcoming appointment at our office. At this time, we are still not allowing visitors during the appointment, however, a support person, over age 33, may accompany you to your appointment if assistance is needed for safety or care concerns. Otherwise, support persons should remain outside until the visit is complete.  ° °We ask if you are sick, have any symptoms of COVID, have had any exposure to anyone suspected or confirmed of having COVID-19, or are awaiting test results for COVID-19, to call our office as we may need to reschedule you for a virtual visit or schedule your appointment for a later date.   ° °Please know we will ask you these questions or similar questions when you arrive for your appointment and understand this is how we are keeping everyone safe.   ° °Also,to keep you safe, please use the provided hand sanitizer when you enter the office. We are asking everyone in the office to wear a mask to help prevent the spread of °germs. If you have a mask of your own, please wear it to your appointment, if not, we are happy to provide one for you. ° °Thank you for understanding and your cooperation.  ° ° °[facility] Staff ° ° ° ° ° °

## 2019-03-04 NOTE — Progress Notes (Signed)
[place] 9+2 wks,single IUP w/ys,positive fht 164 bpm,crl 25.85 mm

## 2019-03-05 ENCOUNTER — Other Ambulatory Visit: Payer: Self-pay

## 2019-03-24 ENCOUNTER — Other Ambulatory Visit: Payer: Self-pay | Admitting: Obstetrics & Gynecology

## 2019-03-24 DIAGNOSIS — Z3682 Encounter for antenatal screening for nuchal translucency: Secondary | ICD-10-CM

## 2019-03-25 ENCOUNTER — Ambulatory Visit: Payer: Self-pay | Admitting: *Deleted

## 2019-03-25 ENCOUNTER — Encounter: Payer: Self-pay | Admitting: Advanced Practice Midwife

## 2019-03-25 ENCOUNTER — Other Ambulatory Visit: Payer: Self-pay

## 2019-04-09 ENCOUNTER — Telehealth: Payer: Self-pay | Admitting: Women's Health

## 2019-04-09 NOTE — Telephone Encounter (Signed)

## 2019-04-13 ENCOUNTER — Encounter: Payer: Self-pay | Admitting: Women's Health

## 2019-04-13 ENCOUNTER — Ambulatory Visit: Payer: Self-pay | Admitting: *Deleted

## 2019-04-13 ENCOUNTER — Other Ambulatory Visit: Payer: Self-pay

## 2019-04-13 ENCOUNTER — Ambulatory Visit (INDEPENDENT_AMBULATORY_CARE_PROVIDER_SITE_OTHER): Payer: Self-pay | Admitting: Women's Health

## 2019-04-13 VITALS — BP 106/71 | HR 102 | Wt 165.0 lb

## 2019-04-13 DIAGNOSIS — F17200 Nicotine dependence, unspecified, uncomplicated: Secondary | ICD-10-CM | POA: Insufficient documentation

## 2019-04-13 DIAGNOSIS — Z3480 Encounter for supervision of other normal pregnancy, unspecified trimester: Secondary | ICD-10-CM | POA: Insufficient documentation

## 2019-04-13 DIAGNOSIS — O99332 Smoking (tobacco) complicating pregnancy, second trimester: Secondary | ICD-10-CM

## 2019-04-13 DIAGNOSIS — O34219 Maternal care for unspecified type scar from previous cesarean delivery: Secondary | ICD-10-CM

## 2019-04-13 DIAGNOSIS — Z363 Encounter for antenatal screening for malformations: Secondary | ICD-10-CM

## 2019-04-13 DIAGNOSIS — Z3482 Encounter for supervision of other normal pregnancy, second trimester: Secondary | ICD-10-CM

## 2019-04-13 DIAGNOSIS — Z98891 History of uterine scar from previous surgery: Secondary | ICD-10-CM

## 2019-04-13 DIAGNOSIS — Z23 Encounter for immunization: Secondary | ICD-10-CM

## 2019-04-13 DIAGNOSIS — Z3A15 15 weeks gestation of pregnancy: Secondary | ICD-10-CM

## 2019-04-13 LAB — POCT URINALYSIS DIPSTICK OB
Blood, UA: NEGATIVE
Glucose, UA: NEGATIVE
Ketones, UA: NEGATIVE
Leukocytes, UA: NEGATIVE
Nitrite, UA: NEGATIVE
POC,PROTEIN,UA: NEGATIVE

## 2019-04-13 NOTE — Patient Instructions (Signed)
Natalie Knapp, I greatly value your feedback.  If you receive a survey following your visit with [place] today, we appreciate you taking the time to fill it out.  Thanks, Natalie Knapp, CNM, [facility]  [place]!!! It is now [place] [facility] ([place], [place] [address]) Entrance located off of [address] parking   Nausea & Vomiting  Have saltine crackers or pretzels by your bed and eat a few bites before you raise your head out of bed in the morning  Eat small frequent meals throughout the day instead of large meals  Drink plenty of fluids throughout the day to stay hydrated, just don't drink a lot of fluids with your meals.  This can make your stomach fill up faster making you feel sick  Do not brush your teeth right after you eat  Products with real ginger are good for nausea, like ginger ale and ginger hard candy Make sure it says made with real ginger!  Sucking on sour candy like lemon heads is also good for nausea  If your prenatal vitamins make you nauseated, take them at night so you will sleep through the nausea  Sea Bands  If you feel like you need medicine for the nausea & vomiting please let us know  If you are unable to keep any fluids or food down please let us know   Constipation  Drink plenty of fluid, preferably water, throughout the day  Eat foods high in fiber such as fruits, vegetables, and grains  Exercise, such as walking, is a good way to keep your bowels regular  Drink warm fluids, especially warm prune juice, or decaf coffee  Eat a 1/2 cup of real oatmeal (not instant), 1/2 cup applesauce, and 1/2-1 cup warm prune juice every day  If needed, you may take Colace (docusate sodium) stool softener once or twice a day to help keep the stool soft.   If you still are having problems with constipation, you may take Miralax once daily as needed to help keep your bowels regular.   Home Blood  Pressure Monitoring for Patients   Your provider has recommended that you check your blood pressure (BP) at least once a week at home. If you do not have a blood pressure cuff at home, one will be provided for you. Contact your provider if you have not received your monitor within 1 week.   Helpful Tips for Accurate Home Blood Pressure Checks  . Don't smoke, exercise, or drink caffeine 30 minutes before checking your BP . Use the restroom before checking your BP (a full bladder can raise your pressure) . Relax in a comfortable upright chair . Feet on the ground . Left arm resting comfortably on a flat surface at the level of your heart . Legs uncrossed . Back supported . Sit quietly and don't talk . Place the cuff on your bare arm . Adjust snuggly, so that only two fingertips can fit between your skin and the top of the cuff . Check 2 readings separated by at least one minute . Keep a log of your BP readings . For a visual, please reference this diagram: http://ccnc.care/bpdiagram  Provider Name: [facility]     Phone: [phone]  Zone 1: ALL CLEAR  Continue to monitor your symptoms:  . BP reading is less than 140 (top number) or less than 90 (bottom number)  . No right upper stomach  pain . No headaches or seeing spots . No feeling nauseated or throwing up . No swelling in face and hands  Zone 2: CAUTION Call your doctor's office for any of the following:  . BP reading is greater than 140 (top number) or greater than 90 (bottom number)  . Stomach pain under your ribs in the middle or right side . Headaches or seeing spots . Feeling nauseated or throwing up . Swelling in face and hands  Zone 3: EMERGENCY  Seek immediate medical care if you have any of the following:  . BP reading is greater than160 (top number) or greater than 110 (bottom number) . Severe headaches not improving with Tylenol . Serious difficulty catching your breath . Any worsening symptoms from Zone  2    Second Trimester of Pregnancy The second trimester is from week 14 through week 27 (months 4 through 6). The second trimester is often a time when you feel your best. Your body has adjusted to being pregnant, and you begin to feel better physically. Usually, morning sickness has lessened or quit completely, you may have more energy, and you may have an increase in appetite. The second trimester is also a time when the fetus is growing rapidly. At the end of the sixth month, the fetus is about 9 inches long and weighs about 1 pounds. You will likely begin to feel the baby move (quickening) between 16 and 20 weeks of pregnancy. Body changes during your second trimester Your body continues to go through many changes during your second trimester. The changes vary from woman to woman.  Your weight will continue to increase. You will notice your lower abdomen bulging out.  You may begin to get stretch marks on your hips, abdomen, and breasts.  You may develop headaches that can be relieved by medicines. The medicines should be approved by your health care provider.  You may urinate more often because the fetus is pressing on your bladder.  You may develop or continue to have heartburn as a result of your pregnancy.  You may develop constipation because certain hormones are causing the muscles that push waste through your intestines to slow down.  You may develop hemorrhoids or swollen, bulging veins (varicose veins).  You may have back pain. This is caused by: ? Weight gain. ? Pregnancy hormones that are relaxing the joints in your pelvis. ? A shift in weight and the muscles that support your balance.  Your breasts will continue to grow and they will continue to become tender.  Your gums may bleed and may be sensitive to brushing and flossing.  Dark spots or blotches (chloasma, mask of pregnancy) may develop on your face. This will likely fade after the baby is born.  A dark line  from your belly button to the pubic area (linea nigra) may appear. This will likely fade after the baby is born.  You may have changes in your hair. These can include thickening of your hair, rapid growth, and changes in texture. Some women also have hair loss during or after pregnancy, or hair that feels dry or thin. Your hair will most likely return to normal after your baby is born. What to expect at prenatal visits During a routine prenatal visit:  You will be weighed to make sure you and the fetus are growing normally.  Your blood pressure will be taken.  Your abdomen will be measured to track your baby's growth.  The fetal heartbeat will be listened to.  Any test results from the previous visit will be discussed. Your health care provider may ask you:  How you are feeling.  If you are feeling the baby move.  If you have had any abnormal symptoms, such as leaking fluid, bleeding, severe headaches, or abdominal cramping.  If you are using any tobacco products, including cigarettes, chewing tobacco, and electronic cigarettes.  If you have any questions. Other tests that may be performed during your second trimester include:  Blood tests that check for: ? Low iron levels (anemia). ? High blood sugar that affects pregnant women (gestational diabetes) between 2 and 28 weeks. ? Rh antibodies. This is to check for a protein on red blood cells (Rh factor).  Urine tests to check for infections, diabetes, or protein in the urine.  An ultrasound to confirm the proper growth and development of the baby.  An amniocentesis to check for possible genetic problems.  Fetal screens for spina bifida and Down syndrome.  HIV (human immunodeficiency virus) testing. Routine prenatal testing includes screening for HIV, unless you choose not to have this test. Follow these instructions at home: Medicines  Follow your health care provider's instructions regarding medicine use. Specific  medicines may be either safe or unsafe to take during pregnancy.  Take a prenatal vitamin that contains at least 600 micrograms (mcg) of folic acid.  If you develop constipation, try taking a stool softener if your health care provider approves. Eating and drinking   Eat a balanced diet that includes fresh fruits and vegetables, whole grains, good sources of protein such as meat, eggs, or tofu, and low-fat dairy. Your health care provider will help you determine the amount of weight gain that is right for you.  Avoid raw meat and uncooked cheese. These carry germs that can cause birth defects in the baby.  If you have low calcium intake from food, talk to your health care provider about whether you should take a daily calcium supplement.  Limit foods that are high in fat and processed sugars, such as fried and sweet foods.  To prevent constipation: ? Drink enough fluid to keep your urine clear or pale yellow. ? Eat foods that are high in fiber, such as fresh fruits and vegetables, whole grains, and beans. Activity  Exercise only as directed by your health care provider. Most women can continue their usual exercise routine during pregnancy. Try to exercise for 30 minutes at least 5 days a week. Stop exercising if you experience uterine contractions.  Avoid heavy lifting, wear low heel shoes, and practice good posture.  A sexual relationship may be continued unless your health care provider directs you otherwise. Relieving pain and discomfort  Wear a good support bra to prevent discomfort from breast tenderness.  Take warm sitz baths to soothe any pain or discomfort caused by hemorrhoids. Use hemorrhoid cream if your health care provider approves.  Rest with your legs elevated if you have leg cramps or low back pain.  If you develop varicose veins, wear support hose. Elevate your feet for 15 minutes, 3-4 times a day. Limit salt in your diet. Prenatal Care  Write down your  questions. Take them to your prenatal visits.  Keep all your prenatal visits as told by your health care provider. This is important. Safety  Wear your seat belt at all times when driving.  Make a list of emergency phone numbers, including numbers for family, friends, the hospital, and police and fire departments. General instructions  Ask your health  care provider for a referral to a local prenatal education class. Begin classes no later than the beginning of month 6 of your pregnancy.  Ask for help if you have counseling or nutritional needs during pregnancy. Your health care provider can offer advice or refer you to specialists for help with various needs.  Do not use hot tubs, steam rooms, or saunas.  Do not douche or use tampons or scented sanitary pads.  Do not cross your legs for long periods of time.  Avoid cat litter boxes and soil used by cats. These carry germs that can cause birth defects in the baby and possibly loss of the fetus by miscarriage or stillbirth.  Avoid all smoking, herbs, alcohol, and unprescribed drugs. Chemicals in these products can affect the formation and growth of the baby.  Do not use any products that contain nicotine or tobacco, such as cigarettes and e-cigarettes. If you need help quitting, ask your health care provider.  Visit your dentist if you have not gone yet during your pregnancy. Use a soft toothbrush to brush your teeth and be gentle when you floss. Contact a health care provider if:  You have dizziness.  You have mild pelvic cramps, pelvic pressure, or nagging pain in the abdominal area.  You have persistent nausea, vomiting, or diarrhea.  You have a bad smelling vaginal discharge.  You have pain when you urinate. Get help right away if:  You have a fever.  You are leaking fluid from your vagina.  You have spotting or bleeding from your vagina.  You have severe abdominal cramping or pain.  You have rapid weight gain or  weight loss.  You have shortness of breath with chest pain.  You notice sudden or extreme swelling of your face, hands, ankles, feet, or legs.  You have not felt your baby move in over an hour.  You have severe headaches that do not go away when you take medicine.  You have vision changes. Summary  The second trimester is from week 14 through week 27 (months 4 through 6). It is also a time when the fetus is growing rapidly.  Your body goes through many changes during pregnancy. The changes vary from woman to woman.  Avoid all smoking, herbs, alcohol, and unprescribed drugs. These chemicals affect the formation and growth your baby.  Do not use any tobacco products, such as cigarettes, chewing tobacco, and e-cigarettes. If you need help quitting, ask your health care provider.  Contact your health care provider if you have any questions. Keep all prenatal visits as told by your health care provider. This is important. This information is not intended to replace advice given to you by your health care provider. Make sure you discuss any questions you have with your health care provider. Document Revised: 05/23/2018 Document Reviewed: 03/06/2016 Elsevier Patient Education  2020 [employer].

## 2019-04-13 NOTE — Progress Notes (Signed)
INITIAL OBSTETRICAL VISIT Patient name: Natalie Knapp MRN [id number]  Date of birth: 08/21/1985 Chief Complaint:   Initial Prenatal Visit  History of Present Illness:   Natalie Knapp is a 34 y.o. G81P1002 Caucasian female at [redacted]w[redacted]d by 9wk u/s, with an Estimated Date of Delivery: 10/05/19 being seen today for her initial obstetrical visit.   Her obstetrical history is significant for LTCS for twins @ 37wks, baby A breech- leaning towards RCS w/ BTL Today she reports no complaints.  Smokes 1/2ppd down from 1ppd, wants to quit Patient's last menstrual period was 01/05/2019 (approximate). Last pap 2016. Results were: normal-doesn't want to do today, will do next time Review of Systems:   Pertinent items are noted in HPI Denies cramping/contractions, leakage of fluid, vaginal bleeding, abnormal vaginal discharge w/ itching/odor/irritation, headaches, visual changes, shortness of breath, chest pain, abdominal pain, severe nausea/vomiting, or problems with urination or bowel movements unless otherwise stated above.  Pertinent History Reviewed:  Reviewed past medical,surgical, social, obstetrical and family history.  Reviewed problem list, medications and allergies. OB History  Gravida Para Term Preterm AB Living  2 1 1     2   SAB TAB Ectopic Multiple Live Births        1 2    # Outcome Date GA Lbr Len/2nd Weight Sex Delivery Anes PTL Lv  2 Current           1A Term 02/21/15 [redacted]w[redacted]d  4 lb 13.4 oz (2.195 kg) F CS-LTranv Spinal  LIV  1B Term 02/21/15 [redacted]w[redacted]d  4 lb 15.4 oz (2.25 kg) F CS-LTranv Spinal  LIV   Physical Assessment:   Vitals:   04/13/19 1000  BP: 106/71  Pulse: (!) 102  Weight: 165 lb (74.8 kg)  Body mass index is 31.18 kg/m.       Physical Examination:  General appearance - well appearing, and in no distress  Mental status - alert, oriented to person, place, and time  Psych:  She has a normal mood and affect  Skin - warm and dry, normal color, no suspicious lesions  noted  Chest - effort normal, all lung fields clear to auscultation bilaterally  Heart - normal rate and regular rhythm  Abdomen - soft, nontender  Extremities:  No swelling or varicosities noted  Thin prep pap is not done   TODAY'S FHT: 152 via doppler  Results for orders placed or performed in visit on 04/13/19 (from the past 24 hour(s))  POC Urinalysis Dipstick OB   Collection Time: 04/13/19 10:15 AM  Result Value Ref Range   Color, UA     Clarity, UA     Glucose, UA Negative Negative   Bilirubin, UA     Ketones, UA neg    Spec Grav, UA     Blood, UA neg    pH, UA     POC,PROTEIN,UA Negative Negative, Trace, Small (1+), Moderate (2+), Large (3+), 4+   Urobilinogen, UA     Nitrite, UA neg    Leukocytes, UA Negative Negative   Appearance     Odor      Assessment & Plan:  1) Low-Risk Pregnancy G2P1002 at [redacted]w[redacted]d with an Estimated Date of Delivery: 10/05/19   2) Initial OB visit  3) H/O LTCS d/t twins w/ baby A breech- leaning towards RCS w/ BTL, gave TOLAC consent home to review  4) Smoker> Smokes 1/2pp/day down from 1ppd, counseled x 3-77mins, advised cessation, discussed risks to fetus while pregnant, to infant pp, and  to herself. Offered QuitlineNC, accepted, referral sent.     Meds: No orders of the defined types were placed in this encounter.   Initial labs obtained Continue prenatal vitamins Reviewed n/v relief measures and warning s/s to report Reviewed recommended weight gain based on pre-gravid BMI Encouraged well-balanced diet Genetic Screening discussed: requested AFP, maternit21 Cystic fibrosis, SMA, Fragile X screening discussed requested Ultrasound discussed; fetal survey: requested CCNC completed>PCM not here, form faxed The nature of [place] for [employer] with multiple MDs and other Advanced Practice Providers was explained to patient; also emphasized that fellows, residents, and students are part of our team. Has home bp cuff. Check  bp weekly, let us know if >140/90.  Flu shot today  Follow-up: Return in about 3 weeks (around 05/04/2019) for [place] w/ pap, JO:INOMVEH, in person, CNM.   Orders Placed This Encounter  Procedures  . Urine Culture  . US OB Comp + 14 Wk  . Obstetric Panel, Including HIV  . Hepatitis C antibody  . Hemoglobinopathy evaluation  . MaterniT 21 plus Core, Blood  . SMN1 COPY NUMBER ANALYSIS (SMA Carrier Screen)  . Fragile X, PCR and [facility]  . AFP TETRA  . Pain Management Screening Profile (10S)  . POC Urinalysis Dipstick OB    Roma Schanz CNM, [facility] 04/13/2019 10:39 AM

## 2019-04-14 LAB — PMP SCREEN PROFILE (10S), URINE
Amphetamine Scrn, Ur: NEGATIVE ng/mL
BARBITURATE SCREEN URINE: NEGATIVE ng/mL
BENZODIAZEPINE SCREEN, URINE: NEGATIVE ng/mL
CANNABINOIDS UR QL SCN: POSITIVE ng/mL — AB
Cocaine (Metab) Scrn, Ur: NEGATIVE ng/mL
Creatinine(Crt), U: 78.9 mg/dL (ref 20.0–300.0)
Methadone Screen, Urine: NEGATIVE ng/mL
OXYCODONE+OXYMORPHONE UR QL SCN: NEGATIVE ng/mL
Opiate Scrn, Ur: POSITIVE ng/mL — AB
Ph of Urine: 7.8 (ref 4.5–8.9)
Phencyclidine Qn, Ur: NEGATIVE ng/mL
Propoxyphene Scrn, Ur: NEGATIVE ng/mL

## 2019-04-15 ENCOUNTER — Encounter: Payer: Self-pay | Admitting: Women's Health

## 2019-04-15 ENCOUNTER — Other Ambulatory Visit: Payer: Self-pay | Admitting: Women's Health

## 2019-04-15 DIAGNOSIS — R8271 Bacteriuria: Secondary | ICD-10-CM | POA: Insufficient documentation

## 2019-04-15 LAB — URINE CULTURE

## 2019-04-15 MED ORDER — AMOXICILLIN 500 MG PO CAPS: 500.0000 mg | ORAL_CAPSULE | Freq: Two times a day (BID) | ORAL | 0 refills | Status: DC

## 2019-04-16 ENCOUNTER — Encounter: Payer: Self-pay | Admitting: Women's Health

## 2019-04-16 DIAGNOSIS — O285 Abnormal chromosomal and genetic finding on antenatal screening of mother: Secondary | ICD-10-CM | POA: Insufficient documentation

## 2019-04-25 LAB — AFP TETRA
DIA Mom Value: 2.61
DIA Value (EIA): 442.69 pg/mL
DSR (By Age)    1 IN: 397
DSR (Second Trimester) 1 IN: 154
Gestational Age: 15 WEEKS
MSAFP Mom: 1.19
MSAFP: 32.5 ng/mL
MSHCG Mom: 1.4
MSHCG: 75140 m[IU]/mL
Maternal Age At EDD: 33.6 yr
Osb Risk: 6704
T18 (By Age): 1:1546 {titer}
Test Results:: POSITIVE — AB
Weight: 165 [lb_av]
uE3 Mom: 0.71
uE3 Value: 0.53 ng/mL

## 2019-04-25 LAB — FRAGILE X, PCR AND SOUTHERN

## 2019-04-25 LAB — MATERNIT 21 PLUS CORE, BLOOD
Fetal Fraction: 14
Result (T21): NEGATIVE
Trisomy 13 (Patau syndrome): NEGATIVE
Trisomy 18 (Edwards syndrome): NEGATIVE
Trisomy 21 (Down syndrome): NEGATIVE

## 2019-04-25 LAB — OBSTETRIC PANEL, INCLUDING HIV
Antibody Screen: NEGATIVE
Basophils Absolute: 0 10*3/uL (ref 0.0–0.2)
Basos: 1 %
EOS (ABSOLUTE): 0.2 10*3/uL (ref 0.0–0.4)
Eos: 3 %
HIV Screen 4th Generation wRfx: NONREACTIVE
Hematocrit: 35.9 % (ref 34.0–46.6)
Hemoglobin: 12.2 g/dL (ref 11.1–15.9)
Hepatitis B Surface Ag: NEGATIVE
Immature Grans (Abs): 0 10*3/uL (ref 0.0–0.1)
Immature Granulocytes: 0 %
Lymphocytes Absolute: 2.6 10*3/uL (ref 0.7–3.1)
Lymphs: 29 %
MCH: 31.7 pg (ref 26.6–33.0)
MCHC: 34 g/dL (ref 31.5–35.7)
MCV: 93 fL (ref 79–97)
Monocytes Absolute: 0.4 10*3/uL (ref 0.1–0.9)
Monocytes: 5 %
Neutrophils Absolute: 5.6 10*3/uL (ref 1.4–7.0)
Neutrophils: 62 %
Platelets: 366 10*3/uL (ref 150–450)
RBC: 3.85 x10E6/uL (ref 3.77–5.28)
RDW: 12.7 % (ref 11.7–15.4)
RPR Ser Ql: NONREACTIVE
Rh Factor: POSITIVE
Rubella Antibodies, IGG: 1.37 index (ref >0.99)
WBC: 8.8 10*3/uL (ref 3.4–10.8)

## 2019-04-25 LAB — SMN1 COPY NUMBER ANALYSIS (SMA CARRIER SCREENING)

## 2019-04-25 LAB — HGB FRACTIONATION CASCADE
Hgb A2: 2.9 % (ref 1.8–3.2)
Hgb A: 96.2 % — ABNORMAL LOW (ref 96.4–98.8)
Hgb F: 0.9 % (ref 0.0–2.0)
Hgb S: 0 %

## 2019-04-25 LAB — COMMENT

## 2019-04-25 LAB — HEPATITIS C ANTIBODY: Hep C Virus Ab: <0.1 s/co ratio (ref 0.0–0.9)

## 2019-05-04 ENCOUNTER — Other Ambulatory Visit: Payer: Self-pay

## 2019-05-04 ENCOUNTER — Other Ambulatory Visit (HOSPITAL_COMMUNITY)
Admission: RE | Admit: 2019-05-04 | Discharge: 2019-05-04 | Disposition: A | Discharge status: Home / Self Care | Payer: Self-pay | Source: Ambulatory Visit | Attending: Obstetrics & Gynecology | Admitting: Obstetrics & Gynecology

## 2019-05-04 ENCOUNTER — Ambulatory Visit (INDEPENDENT_AMBULATORY_CARE_PROVIDER_SITE_OTHER): Payer: Self-pay

## 2019-05-04 ENCOUNTER — Encounter: Payer: Self-pay | Admitting: Obstetrics & Gynecology

## 2019-05-04 ENCOUNTER — Ambulatory Visit (INDEPENDENT_AMBULATORY_CARE_PROVIDER_SITE_OTHER): Payer: Self-pay | Admitting: Obstetrics & Gynecology

## 2019-05-04 VITALS — BP 106/75 | HR 96 | Wt 170.0 lb

## 2019-05-04 DIAGNOSIS — Z331 Pregnant state, incidental: Secondary | ICD-10-CM

## 2019-05-04 DIAGNOSIS — Z3482 Encounter for supervision of other normal pregnancy, second trimester: Secondary | ICD-10-CM

## 2019-05-04 DIAGNOSIS — Z363 Encounter for antenatal screening for malformations: Secondary | ICD-10-CM

## 2019-05-04 DIAGNOSIS — Z1389 Encounter for screening for other disorder: Secondary | ICD-10-CM

## 2019-05-04 DIAGNOSIS — Z3A18 18 weeks gestation of pregnancy: Secondary | ICD-10-CM

## 2019-05-04 DIAGNOSIS — Z87440 Personal history of urinary (tract) infections: Secondary | ICD-10-CM

## 2019-05-04 DIAGNOSIS — Z01419 Encounter for gynecological examination (general) (routine) without abnormal findings: Secondary | ICD-10-CM | POA: Insufficient documentation

## 2019-05-04 DIAGNOSIS — F17200 Nicotine dependence, unspecified, uncomplicated: Secondary | ICD-10-CM

## 2019-05-04 LAB — POCT URINALYSIS DIPSTICK OB
Blood, UA: NEGATIVE
Glucose, UA: NEGATIVE
Ketones, UA: NEGATIVE
Leukocytes, UA: NEGATIVE
Nitrite, UA: NEGATIVE
POC,PROTEIN,UA: NEGATIVE

## 2019-05-04 NOTE — Progress Notes (Signed)
[place] 18 wks,cephalic,posterior placenta gr 0,cx 4.6 cm,normal ovaries,svp of fluid 4.1 cm, two LVEICF 1.6,1.5 mm,fhr 144 bpm,efw 236 g 67%,anatomy complete

## 2019-05-04 NOTE — Progress Notes (Signed)
   LOW-RISK PREGNANCY VISIT Patient name: Natalie Knapp MRN [id number]  Date of birth: 29-Jun-1985 Chief Complaint:   Routine Prenatal Visit (Pap and Ultrasound)  History of Present Illness:   Natalie Knapp is a 34 y.o. G10P1002 female at [redacted]w[redacted]d with an Estimated Date of Delivery: 10/05/19 being seen today for ongoing management of a low-risk pregnancy.  Today she reports no complaints. Contractions: Not present. Vag. Bleeding: None.  Movement: Present. denies leaking of fluid. Review of Systems:   Pertinent items are noted in HPI Denies abnormal vaginal discharge w/ itching/odor/irritation, headaches, visual changes, shortness of breath, chest pain, abdominal pain, severe nausea/vomiting, or problems with urination or bowel movements unless otherwise stated above. Pertinent History Reviewed:  Reviewed past medical,surgical, social, obstetrical and family history.  Reviewed problem list, medications and allergies. Physical Assessment:   Vitals:   05/04/19 1200  BP: 106/75  Pulse: 96  Weight: 170 lb (77.1 kg)  Body mass index is 32.12 kg/m.        Physical Examination:   General appearance: Well appearing, and in no distress  Mental status: Alert, oriented to person, place, and time  Skin: Warm & dry  Cardiovascular: Normal heart rate noted  Respiratory: Normal respiratory effort, no distress  Abdomen: Soft, gravid, nontender  Pelvic: Cervical exam deferred         Extremities: Edema: None  Fetal Status: Fetal Heart Rate (bpm): 144   Movement: Present    Chaperone: [place]    Results for orders placed or performed in visit on 05/04/19 (from the past 24 hour(s))  POC Urinalysis Dipstick OB   Collection Time: 05/04/19 11:59 AM  Result Value Ref Range   Color, UA     Clarity, UA     Glucose, UA Negative Negative   Bilirubin, UA     Ketones, UA n    Spec Grav, UA     Blood, UA n    pH, UA     POC,PROTEIN,UA Negative Negative, Trace, Small (1+), Moderate (2+), Large  (3+), 4+   Urobilinogen, UA     Nitrite, UA n    Leukocytes, UA Negative Negative   Appearance     Odor      Assessment & Plan:  1) Low-risk pregnancy G2P1002 at [redacted]w[redacted]d with an Estimated Date of Delivery: 10/05/19   2) Previous C section, desires sterilization,    Meds: No orders of the defined types were placed in this encounter.  Labs/procedures today:   Plan:  Continue routine obstetrical care  Next visit: prefers online    Reviewed: Preterm labor symptoms and general obstetric precautions including but not limited to vaginal bleeding, contractions, leaking of fluid and fetal movement were reviewed in detail with the patient.  All questions were answered. Has home bp cuff. Rx faxed to . Check bp weekly, let us know if >140/90.   Follow-up: Return in about 4 weeks (around 06/01/2019) for MyChart Connect visit, LROB.  Orders Placed This Encounter  Procedures  . POC Urinalysis Dipstick OB   Lazaro Arms  05/04/2019 12:28 PM

## 2019-05-04 NOTE — Addendum Note (Signed)
Addended by: Lynnell Dike on: 05/04/2019 12:32 PM   Modules accepted: Orders

## 2019-05-05 LAB — CYTOLOGY - PAP
Comment: NEGATIVE
Diagnosis: NEGATIVE
High risk HPV: NEGATIVE

## 2019-05-07 LAB — URINE CULTURE

## 2019-06-01 ENCOUNTER — Telehealth: Payer: Self-pay | Admitting: Advanced Practice Midwife

## 2019-09-29 ENCOUNTER — Other Ambulatory Visit: Payer: Self-pay | Admitting: Obstetrics and Gynecology

## 2019-09-29 ENCOUNTER — Other Ambulatory Visit: Payer: Self-pay

## 2019-09-29 ENCOUNTER — Other Ambulatory Visit (HOSPITAL_COMMUNITY)
Admission: RE | Admit: 2019-09-29 | Discharge: 2019-09-29 | Disposition: A | Discharge status: Home / Self Care | Payer: Medicaid Other | Source: Ambulatory Visit | Attending: Obstetrics and Gynecology | Admitting: Obstetrics and Gynecology

## 2019-09-29 ENCOUNTER — Encounter: Payer: Self-pay | Admitting: Obstetrics and Gynecology

## 2019-09-29 ENCOUNTER — Ambulatory Visit (INDEPENDENT_AMBULATORY_CARE_PROVIDER_SITE_OTHER): Payer: Medicaid Other | Admitting: Obstetrics and Gynecology

## 2019-09-29 VITALS — BP 118/81 | HR 101 | Wt 186.0 lb

## 2019-09-29 DIAGNOSIS — O0933 Supervision of pregnancy with insufficient antenatal care, third trimester: Secondary | ICD-10-CM

## 2019-09-29 DIAGNOSIS — Z3480 Encounter for supervision of other normal pregnancy, unspecified trimester: Secondary | ICD-10-CM

## 2019-09-29 DIAGNOSIS — Z1389 Encounter for screening for other disorder: Secondary | ICD-10-CM

## 2019-09-29 DIAGNOSIS — Z3A39 39 weeks gestation of pregnancy: Secondary | ICD-10-CM | POA: Diagnosis present

## 2019-09-29 DIAGNOSIS — Z331 Pregnant state, incidental: Secondary | ICD-10-CM | POA: Diagnosis not present

## 2019-09-29 LAB — POCT URINALYSIS DIPSTICK OB
Blood, UA: NEGATIVE
Glucose, UA: NEGATIVE
Ketones, UA: NEGATIVE
Leukocytes, UA: NEGATIVE
Nitrite, UA: NEGATIVE
POC,PROTEIN,UA: NEGATIVE

## 2019-09-29 NOTE — Addendum Note (Signed)
Addended by: Colen Darling on: 09/29/2019 12:39 PM   Modules accepted: Orders

## 2019-09-29 NOTE — Progress Notes (Addendum)
PATIENT ID: Natalie Knapp, female     DOB: Jan 17, 1986, 34 y.o.     MRN: [id number]    LOW-RISK PREGNANCY VISIT PATIENT NAME: Natalie Knapp MRN [id number]  DOB: [birth year]  Chief Complaint:   Routine Prenatal Visit   History of Present Illness:   [name] is a 34 y.o. G45P1002 female at [redacted]w[redacted]d  Prior c/s x 1, with an Estimated Date of Delivery: 10/05/19 being seen today for ongoing management of a low-risk pregnancy. She again declines TOLAC. She is signing the Tubal forms today, having acknowledged her desire for permanent sterilization on 05/04/2019 . She has not kept an appointment since that date, which she attributes to the death of maternal grandmother and another close relative. She has already lost her mother and Father.. She denies depression. PHG -9 score 1.  Depression screen [facility] 2/9 04/13/2019  Decreased Interest 0  Down, Depressed, Hopeless 0  PHQ - 2 Score 0  Altered sleeping 0  Tired, decreased energy 0  Change in appetite 1  Feeling bad or failure about yourself  0  Trouble concentrating 0  Moving slowly or fidgety/restless 0  Suicidal thoughts 0  PHQ-9 Score 1  Difficult doing work/chores Not difficult at all    Today she reports backache.  .  .   . denies leaking of fluid.  Review of Systems:   Pertinent items are noted in HPI Denies abnormal vaginal discharge w/ itching/odor/irritation, headaches, visual changes, shortness of breath, chest pain, abdominal pain, severe nausea/vomiting, or problems with urination or bowel movements unless otherwise stated above.  Pertinent History Reviewed:  Reviewed past medical,surgical, social, obstetrical and family history.  Reviewed problem list, medications and allergies.  Physical Assessment:  There were no vitals filed for this visit.There is no height or weight on file to calculate BMI.        Physical Examination:   General appearance: Well appearing, and in no distress  Mental status: Alert, oriented to person,  place, and time  Skin: Warm & dry  Cardiovascular: Normal heart rate noted  Respiratory: Normal respiratory effort, no distress  Abdomen: Soft, gravid, nontender   Baby: 151   39 cm  Pelvic: Cervical exam deferred 1-2 cm firm vertex         Extremities:  no swelling.  Fetal Status:        fhr fine 151  Chaperone: [name]    No results found for this or any previous visit (from the past 24 hour(s)).  Assessment & Plan:  1) Low-risk pregnancy G2P1002 at [redacted]w[redacted]d with an Estimated Date of Delivery: 10/05/19   2) prior cesarean not desiring TOLAC,   3 ) desires permanent sterilization. Consent signed.    Meds: No orders of the defined types were placed in this encounter.  Labs/procedures today: I personally performed the services described in this documentation, which was SCRIBED in my presence. The recorded information has been reviewed and considered accurate. It has been edited as necessary during review. Tilda Burrow, MD    Plan:  Continue routine obstetrical care  Schedule c/s scheduled for Sunday 22 9 a.m           Tubal consent signed.*2 Next visit: prefers in person    Reviewed: Term labor symptoms and general obstetric precautions including but not limited to vaginal bleeding, contractions, leaking of fluid and fetal movement were reviewed in detail with the patient.  All questions were answered. Check bp weekly, let us know if >140/90.  Follow-up: 14 days +-.   By signing my name below, I, [employer], attest that this documentation has been prepared under the direction and in the presence of Tilda Burrow, MD. Electronically Signed: Mal Misty Medical Scribe. 09/29/19. 10:58 AM.  I personally performed the services described in this documentation, which was SCRIBED in my presence. The recorded information has been reviewed and considered accurate. It has been edited as necessary during review. Tilda Burrow, MD

## 2019-09-30 LAB — CERVICOVAGINAL ANCILLARY ONLY
Chlamydia: NEGATIVE
Comment: NEGATIVE
Comment: NORMAL
Neisseria Gonorrhea: NEGATIVE

## 2019-10-02 ENCOUNTER — Other Ambulatory Visit (HOSPITAL_COMMUNITY)
Admission: RE | Admit: 2019-10-02 | Discharge: 2019-10-02 | Disposition: A | Discharge status: Home / Self Care | Payer: Medicaid Other | Source: Ambulatory Visit | Attending: Obstetrics and Gynecology | Admitting: Obstetrics and Gynecology

## 2019-10-02 ENCOUNTER — Other Ambulatory Visit: Payer: Self-pay

## 2019-10-02 DIAGNOSIS — Z01812 Encounter for preprocedural laboratory examination: Secondary | ICD-10-CM | POA: Insufficient documentation

## 2019-10-02 DIAGNOSIS — Z20822 Contact with and (suspected) exposure to covid-19: Secondary | ICD-10-CM | POA: Insufficient documentation

## 2019-10-02 LAB — TYPE AND SCREEN
ABO/RH(D): O POS
Antibody Screen: NEGATIVE

## 2019-10-02 LAB — CBC
HCT: 32.4 % — ABNORMAL LOW (ref 36.0–46.0)
Hemoglobin: 10.6 g/dL — ABNORMAL LOW (ref 12.0–15.0)
MCH: 28.9 pg (ref 26.0–34.0)
MCHC: 32.7 g/dL (ref 30.0–36.0)
MCV: 88.3 fL (ref 80.0–100.0)
Platelets: 366 10*3/uL (ref 150–400)
RBC: 3.67 MIL/uL — ABNORMAL LOW (ref 3.87–5.11)
RDW: 13.4 % (ref 11.5–15.5)
WBC: 12.1 10*3/uL — ABNORMAL HIGH (ref 4.0–10.5)
nRBC: 0 % (ref 0.0–0.2)

## 2019-10-02 LAB — SARS CORONAVIRUS 2 (TAT 6-24 HRS): SARS Coronavirus 2: NEGATIVE

## 2019-10-04 ENCOUNTER — Inpatient Hospital Stay (HOSPITAL_COMMUNITY): Payer: Medicaid Other | Admitting: Anesthesiology

## 2019-10-04 ENCOUNTER — Encounter (HOSPITAL_COMMUNITY): Payer: Self-pay | Admitting: Obstetrics and Gynecology

## 2019-10-04 ENCOUNTER — Encounter (HOSPITAL_COMMUNITY)
Admission: RE | Disposition: A | Discharge status: Home / Self Care | Payer: Self-pay | Source: Home / Self Care | Attending: Obstetrics and Gynecology

## 2019-10-04 ENCOUNTER — Other Ambulatory Visit: Payer: Self-pay

## 2019-10-04 ENCOUNTER — Inpatient Hospital Stay (HOSPITAL_COMMUNITY)
Admission: RE | Admit: 2019-10-04 | Discharge: 2019-10-06 | DRG: 785 | Disposition: A | Discharge status: Home / Self Care | Payer: Medicaid Other | Attending: Obstetrics and Gynecology | Admitting: Obstetrics and Gynecology

## 2019-10-04 DIAGNOSIS — O99323 Drug use complicating pregnancy, third trimester: Secondary | ICD-10-CM

## 2019-10-04 DIAGNOSIS — Z3A39 39 weeks gestation of pregnancy: Secondary | ICD-10-CM | POA: Diagnosis not present

## 2019-10-04 DIAGNOSIS — Z302 Encounter for sterilization: Secondary | ICD-10-CM

## 2019-10-04 DIAGNOSIS — O0933 Supervision of pregnancy with insufficient antenatal care, third trimester: Secondary | ICD-10-CM

## 2019-10-04 DIAGNOSIS — O34211 Maternal care for low transverse scar from previous cesarean delivery: Secondary | ICD-10-CM | POA: Diagnosis present

## 2019-10-04 DIAGNOSIS — Z87891 Personal history of nicotine dependence: Secondary | ICD-10-CM

## 2019-10-04 DIAGNOSIS — Z98891 History of uterine scar from previous surgery: Secondary | ICD-10-CM | POA: Diagnosis present

## 2019-10-04 LAB — RAPID URINE DRUG SCREEN, HOSP PERFORMED
Amphetamines: NOT DETECTED
Barbiturates: NOT DETECTED
Benzodiazepines: NOT DETECTED
Cocaine: NOT DETECTED
Opiates: NOT DETECTED
Tetrahydrocannabinol: POSITIVE — AB

## 2019-10-04 LAB — URINALYSIS, ROUTINE W REFLEX MICROSCOPIC
Bilirubin Urine: NEGATIVE
Glucose, UA: NEGATIVE mg/dL
Hgb urine dipstick: NEGATIVE
Ketones, ur: NEGATIVE mg/dL
Leukocytes,Ua: NEGATIVE
Nitrite: NEGATIVE
Protein, ur: NEGATIVE mg/dL
Specific Gravity, Urine: 1.012 (ref 1.005–1.030)
pH: 8 (ref 5.0–8.0)

## 2019-10-04 SURGERY — Surgical Case
Anesthesia: Spinal

## 2019-10-04 MED ORDER — SODIUM CHLORIDE 0.9 % IV SOLN: INTRAVENOUS | Status: DC | PRN

## 2019-10-04 MED ORDER — KETOROLAC TROMETHAMINE 30 MG/ML IJ SOLN: 30.0000 mg | Freq: Four times a day (QID) | INTRAMUSCULAR | Status: AC | PRN

## 2019-10-04 MED ORDER — SIMETHICONE 80 MG PO CHEW
80.0000 mg | CHEWABLE_TABLET | ORAL | Status: DC
  Administered 2019-10-04 – 2019-10-05 (×2): 80 mg via ORAL
  Filled 2019-10-04 (×2): qty 1

## 2019-10-04 MED ORDER — PHENYLEPHRINE 40 MCG/ML (10ML) SYRINGE FOR IV PUSH (FOR BLOOD PRESSURE SUPPORT)
PREFILLED_SYRINGE | INTRAVENOUS | Status: AC
  Filled 2019-10-04: qty 10

## 2019-10-04 MED ORDER — MORPHINE SULFATE (PF) 0.5 MG/ML IJ SOLN
INTRAMUSCULAR | Status: DC | PRN
Start: 2019-10-04 — End: 2019-10-04
  Administered 2019-10-04: .15 mg via INTRATHECAL

## 2019-10-04 MED ORDER — SCOPOLAMINE 1 MG/3DAYS TD PT72: 1.0000 | MEDICATED_PATCH | Freq: Once | TRANSDERMAL | Status: DC

## 2019-10-04 MED ORDER — LACTATED RINGERS IV SOLN: INTRAVENOUS | Status: DC | PRN

## 2019-10-04 MED ORDER — COCONUT OIL OIL: 1.0000 "application " | TOPICAL_OIL | Status: DC | PRN

## 2019-10-04 MED ORDER — ONDANSETRON HCL 4 MG/2ML IJ SOLN
INTRAMUSCULAR | Status: DC | PRN
  Administered 2019-10-04: 4 mg via INTRAVENOUS

## 2019-10-04 MED ORDER — NALBUPHINE HCL 10 MG/ML IJ SOLN: 5.0000 mg | Freq: Once | INTRAMUSCULAR | Status: DC | PRN

## 2019-10-04 MED ORDER — SIMETHICONE 80 MG PO CHEW: 80.0000 mg | CHEWABLE_TABLET | ORAL | Status: DC | PRN

## 2019-10-04 MED ORDER — CEFAZOLIN SODIUM-DEXTROSE 2-4 GM/100ML-% IV SOLN
2.0000 g | INTRAVENOUS | Status: AC
  Administered 2019-10-04: 2 g via INTRAVENOUS

## 2019-10-04 MED ORDER — PHENYLEPHRINE HCL-NACL 20-0.9 MG/250ML-% IV SOLN
INTRAVENOUS | Status: AC
  Filled 2019-10-04: qty 250

## 2019-10-04 MED ORDER — ACETAMINOPHEN 500 MG PO TABS
1000.0000 mg | ORAL_TABLET | Freq: Four times a day (QID) | ORAL | Status: AC
  Administered 2019-10-04 – 2019-10-05 (×4): 1000 mg via ORAL
  Filled 2019-10-04 (×4): qty 2

## 2019-10-04 MED ORDER — OXYTOCIN-SODIUM CHLORIDE 30-0.9 UT/500ML-% IV SOLN
INTRAVENOUS | Status: DC | PRN
  Administered 2019-10-04: 30 [IU] via INTRAVENOUS

## 2019-10-04 MED ORDER — PHENYLEPHRINE 40 MCG/ML (10ML) SYRINGE FOR IV PUSH (FOR BLOOD PRESSURE SUPPORT)
PREFILLED_SYRINGE | INTRAVENOUS | Status: DC | PRN
  Administered 2019-10-04: 120 ug via INTRAVENOUS

## 2019-10-04 MED ORDER — NALOXONE HCL 0.4 MG/ML IJ SOLN: 0.4000 mg | INTRAMUSCULAR | Status: DC | PRN

## 2019-10-04 MED ORDER — KETOROLAC TROMETHAMINE 30 MG/ML IJ SOLN
30.0000 mg | Freq: Four times a day (QID) | INTRAMUSCULAR | Status: AC | PRN
  Administered 2019-10-04 – 2019-10-05 (×3): 30 mg via INTRAVENOUS
  Filled 2019-10-04 (×3): qty 1

## 2019-10-04 MED ORDER — PRENATAL MULTIVITAMIN CH
1.0000 | ORAL_TABLET | Freq: Every day | ORAL | Status: DC
  Administered 2019-10-05 – 2019-10-06 (×2): 1 via ORAL
  Filled 2019-10-04 (×2): qty 1

## 2019-10-04 MED ORDER — KETOROLAC TROMETHAMINE 30 MG/ML IJ SOLN: 30.0000 mg | Freq: Once | INTRAMUSCULAR | Status: DC | PRN

## 2019-10-04 MED ORDER — POVIDONE-IODINE 10 % EX SWAB
2.0000 "application " | Freq: Once | CUTANEOUS | Status: AC
  Administered 2019-10-04: 2 via TOPICAL

## 2019-10-04 MED ORDER — WITCH HAZEL-GLYCERIN EX PADS: 1.0000 "application " | MEDICATED_PAD | CUTANEOUS | Status: DC | PRN

## 2019-10-04 MED ORDER — DEXAMETHASONE SODIUM PHOSPHATE 10 MG/ML IJ SOLN
INTRAMUSCULAR | Status: AC
  Filled 2019-10-04: qty 1

## 2019-10-04 MED ORDER — ZOLPIDEM TARTRATE 5 MG PO TABS: 5.0000 mg | ORAL_TABLET | Freq: Every evening | ORAL | Status: DC | PRN

## 2019-10-04 MED ORDER — HYDROMORPHONE HCL 1 MG/ML IJ SOLN: 0.2500 mg | INTRAMUSCULAR | Status: DC | PRN

## 2019-10-04 MED ORDER — LACTATED RINGERS IV SOLN: INTRAVENOUS | Status: DC

## 2019-10-04 MED ORDER — ONDANSETRON HCL 4 MG/2ML IJ SOLN
INTRAMUSCULAR | Status: AC
  Filled 2019-10-04: qty 2

## 2019-10-04 MED ORDER — PHENYLEPHRINE HCL-NACL 20-0.9 MG/250ML-% IV SOLN
INTRAVENOUS | Status: DC | PRN
  Administered 2019-10-04: 60 ug/min via INTRAVENOUS

## 2019-10-04 MED ORDER — MEPERIDINE HCL 25 MG/ML IJ SOLN: 6.2500 mg | INTRAMUSCULAR | Status: DC | PRN

## 2019-10-04 MED ORDER — ONDANSETRON HCL 4 MG/2ML IJ SOLN: 4.0000 mg | Freq: Three times a day (TID) | INTRAMUSCULAR | Status: DC | PRN

## 2019-10-04 MED ORDER — MENTHOL 3 MG MT LOZG: 1.0000 | LOZENGE | OROMUCOSAL | Status: DC | PRN

## 2019-10-04 MED ORDER — SENNOSIDES-DOCUSATE SODIUM 8.6-50 MG PO TABS
2.0000 | ORAL_TABLET | ORAL | Status: DC
  Administered 2019-10-04: 2 via ORAL
  Filled 2019-10-04 (×2): qty 2

## 2019-10-04 MED ORDER — SIMETHICONE 80 MG PO CHEW
80.0000 mg | CHEWABLE_TABLET | Freq: Three times a day (TID) | ORAL | Status: DC
  Administered 2019-10-04 – 2019-10-06 (×6): 80 mg via ORAL
  Filled 2019-10-04 (×6): qty 1

## 2019-10-04 MED ORDER — MORPHINE SULFATE (PF) 0.5 MG/ML IJ SOLN
INTRAMUSCULAR | Status: AC
  Filled 2019-10-04: qty 10

## 2019-10-04 MED ORDER — OXYTOCIN-SODIUM CHLORIDE 30-0.9 UT/500ML-% IV SOLN
INTRAVENOUS | Status: AC
  Filled 2019-10-04: qty 500

## 2019-10-04 MED ORDER — NALBUPHINE HCL 10 MG/ML IJ SOLN: 5.0000 mg | INTRAMUSCULAR | Status: DC | PRN

## 2019-10-04 MED ORDER — DIPHENHYDRAMINE HCL 25 MG PO CAPS: 25.0000 mg | ORAL_CAPSULE | Freq: Four times a day (QID) | ORAL | Status: DC | PRN

## 2019-10-04 MED ORDER — DIBUCAINE (PERIANAL) 1 % EX OINT: 1.0000 "application " | TOPICAL_OINTMENT | CUTANEOUS | Status: DC | PRN

## 2019-10-04 MED ORDER — DEXAMETHASONE SODIUM PHOSPHATE 10 MG/ML IJ SOLN
INTRAMUSCULAR | Status: DC | PRN
  Administered 2019-10-04: 10 mg via INTRAVENOUS

## 2019-10-04 MED ORDER — OXYTOCIN-SODIUM CHLORIDE 30-0.9 UT/500ML-% IV SOLN: 2.5000 [IU]/h | INTRAVENOUS | Status: AC

## 2019-10-04 MED ORDER — ENOXAPARIN SODIUM 40 MG/0.4ML ~~LOC~~ SOLN
40.0000 mg | SUBCUTANEOUS | Status: DC
  Administered 2019-10-05 – 2019-10-06 (×2): 40 mg via SUBCUTANEOUS
  Filled 2019-10-04 (×2): qty 0.4

## 2019-10-04 MED ORDER — FENTANYL CITRATE (PF) 100 MCG/2ML IJ SOLN
INTRAMUSCULAR | Status: DC | PRN
Start: 2019-10-04 — End: 2019-10-04
  Administered 2019-10-04: 15 ug via INTRATHECAL

## 2019-10-04 MED ORDER — BUPIVACAINE IN DEXTROSE 0.75-8.25 % IT SOLN
INTRATHECAL | Status: DC | PRN
  Administered 2019-10-04: 12 mg via INTRATHECAL

## 2019-10-04 MED ORDER — DIPHENHYDRAMINE HCL 50 MG/ML IJ SOLN: 12.5000 mg | INTRAMUSCULAR | Status: DC | PRN

## 2019-10-04 MED ORDER — GABAPENTIN 100 MG PO CAPS
100.0000 mg | ORAL_CAPSULE | Freq: Two times a day (BID) | ORAL | Status: DC
  Administered 2019-10-04 – 2019-10-06 (×5): 100 mg via ORAL
  Filled 2019-10-04 (×5): qty 1

## 2019-10-04 MED ORDER — SODIUM CHLORIDE 0.9% FLUSH: 3.0000 mL | INTRAVENOUS | Status: DC | PRN

## 2019-10-04 MED ORDER — NALOXONE HCL 4 MG/10ML IJ SOLN
1.0000 ug/kg/h | INTRAVENOUS | Status: DC | PRN
  Filled 2019-10-04: qty 5

## 2019-10-04 MED ORDER — CEFAZOLIN SODIUM-DEXTROSE 2-4 GM/100ML-% IV SOLN
INTRAVENOUS | Status: AC
  Filled 2019-10-04: qty 100

## 2019-10-04 MED ORDER — DIPHENHYDRAMINE HCL 25 MG PO CAPS: 25.0000 mg | ORAL_CAPSULE | ORAL | Status: DC | PRN

## 2019-10-04 MED ORDER — FENTANYL CITRATE (PF) 100 MCG/2ML IJ SOLN
INTRAMUSCULAR | Status: AC
  Filled 2019-10-04: qty 2

## 2019-10-04 MED ORDER — PROMETHAZINE HCL 25 MG/ML IJ SOLN: 6.2500 mg | INTRAMUSCULAR | Status: DC | PRN

## 2019-10-04 SURGICAL SUPPLY — 37 items
BENZOIN TINCTURE PRP APPL 2/3 (GAUZE/BANDAGES/DRESSINGS) ×3 IMPLANT
CLAMP CORD UMBIL (MISCELLANEOUS) ×3 IMPLANT
CLOSURE STERI-STRIP 1/2X4 (GAUZE/BANDAGES/DRESSINGS) ×1
CLOSURE WOUND 1/2 X4 (GAUZE/BANDAGES/DRESSINGS)
CLOTH BEACON ORANGE TIMEOUT ST (SAFETY) ×3 IMPLANT
CLSR STERI-STRIP ANTIMIC 1/2X4 (GAUZE/BANDAGES/DRESSINGS) ×2 IMPLANT
DRSG OPSITE POSTOP 4X10 (GAUZE/BANDAGES/DRESSINGS) ×3 IMPLANT
ELECT REM PT RETURN 9FT ADLT (ELECTROSURGICAL) ×3
ELECTRODE REM PT RTRN 9FT ADLT (ELECTROSURGICAL) ×1 IMPLANT
EXTRACTOR VACUUM KIWI (MISCELLANEOUS) IMPLANT
GLOVE BIOGEL PI IND STRL 7.0 (GLOVE) ×1 IMPLANT
GLOVE BIOGEL PI IND STRL 9 (GLOVE) ×1 IMPLANT
GLOVE BIOGEL PI INDICATOR 7.0 (GLOVE) ×2
GLOVE BIOGEL PI INDICATOR 9 (GLOVE) ×2
GLOVE SS PI 9.0 STRL (GLOVE) ×3 IMPLANT
GOWN STRL REUS W/TWL 2XL LVL3 (GOWN DISPOSABLE) ×3 IMPLANT
GOWN STRL REUS W/TWL LRG LVL3 (GOWN DISPOSABLE) ×6 IMPLANT
NEEDLE HYPO 25X5/8 SAFETYGLIDE (NEEDLE) IMPLANT
NS IRRIG 1000ML POUR BTL (IV SOLUTION) ×3 IMPLANT
PACK C SECTION WH (CUSTOM PROCEDURE TRAY) ×3 IMPLANT
PAD OB MATERNITY 4.3X12.25 (PERSONAL CARE ITEMS) ×3 IMPLANT
PENCIL SMOKE EVAC W/HOLSTER (ELECTROSURGICAL) ×3 IMPLANT
RTRCTR C-SECT PINK 25CM LRG (MISCELLANEOUS) IMPLANT
RTRCTR C-SECT PINK 34CM XLRG (MISCELLANEOUS) IMPLANT
STRIP CLOSURE SKIN 1/2X4 (GAUZE/BANDAGES/DRESSINGS) IMPLANT
SUT MNCRL 0 VIOLET CTX 36 (SUTURE) ×2 IMPLANT
SUT MONOCRYL 0 CTX 36 (SUTURE) ×4
SUT PLAIN 2 0 XLH (SUTURE) ×6 IMPLANT
SUT VIC AB 0 CT1 27 (SUTURE) ×2
SUT VIC AB 0 CT1 27XBRD ANBCTR (SUTURE) ×1 IMPLANT
SUT VIC AB 2-0 CT1 27 (SUTURE) ×4
SUT VIC AB 2-0 CT1 TAPERPNT 27 (SUTURE) ×2 IMPLANT
SUT VIC AB 4-0 KS 27 (SUTURE) ×3 IMPLANT
SYR BULB IRRIGATION 50ML (SYRINGE) IMPLANT
TOWEL OR 17X24 6PK STRL BLUE (TOWEL DISPOSABLE) ×3 IMPLANT
TRAY FOLEY W/BAG SLVR 14FR LF (SET/KITS/TRAYS/PACK) ×3 IMPLANT
WATER STERILE IRR 1000ML POUR (IV SOLUTION) ×3 IMPLANT

## 2019-10-04 NOTE — Anesthesia Procedure Notes (Signed)
Spinal  Patient location during procedure: OR Start time: 10/04/2019 9:18 AM End time: 10/04/2019 9:22 AM Staffing Performed: anesthesiologist  Anesthesiologist: Leilani Able, MD Preanesthetic Checklist Completed: patient identified, IV checked, site marked, risks and benefits discussed, surgical consent, monitors and equipment checked, pre-op evaluation and timeout performed Spinal Block Patient position: sitting Prep: DuraPrep and site prepped and draped Patient monitoring: continuous pulse ox and blood pressure Approach: midline Location: L3-4 Injection technique: single-shot Needle Needle type: Pencan  Needle gauge: 24 G Needle length: 10 cm Needle insertion depth: 6 cm Assessment Sensory level: T4

## 2019-10-04 NOTE — H&P (Signed)
LOW-RISK PREGNANCY VISIT PATIENT NAME: Natalie Knapp      MRN [id number]  DOB: 06-18-1985  Chief Complaint:   Routine Prenatal Visit   History of Present Illness:   Natalie Knapp is a 34 y.o. G20P1002 female at [redacted]w[redacted]d  Prior c/s x 1, with an Estimated Date of Delivery: 10/05/19 being seen today for ongoing management of a low-risk pregnancy. She again declines TOLAC. She is signing the Tubal forms today, having acknowledged her desire for permanent sterilization on 05/04/2019 . She has not kept an appointment since that date, which she attributes to the death of maternal grandmother and another close relative. She has already lost her mother and Father.. She denies depression. PHG -9 score 1.  Depression screen [facility] 2/9 04/13/2019  Decreased Interest 0  Down, Depressed, Hopeless 0  PHQ - 2 Score 0  Altered sleeping 0  Tired, decreased energy 0  Change in appetite 1  Feeling bad or failure about yourself  0  Trouble concentrating 0  Moving slowly or fidgety/restless 0  Suicidal thoughts 0  PHQ-9 Score 1  Difficult doing work/chores Not difficult at all    Today she reports backache.  .  .   . denies leaking of fluid.  Review of Systems:   Pertinent items are noted in HPI Denies abnormal vaginal discharge w/ itching/odor/irritation, headaches, visual changes, shortness of breath, chest pain, abdominal pain, severe nausea/vomiting, or problems with urination or bowel movements unless otherwise stated above.  Pertinent History Reviewed:  Reviewed past medical,surgical, social, obstetrical and family history.  Reviewed problem list, medications and allergies.  Physical Assessment:  There were no vitals filed for this visit.There is no height or weight on file to calculate BMI.                   Physical Examination:              General appearance: Well appearing, and in no distress             Mental status: Alert, oriented to person, place, and time             Skin: Warm &  dry             Cardiovascular: Normal heart rate noted             Respiratory: Normal respiratory effort, no distress             Abdomen: Soft, gravid, nontender                         Baby: 151                         39 cm             Pelvic: Cervical exam deferred 1-2 cm firm vertex                     Extremities:  no swelling.  Fetal Status:        fhr fine 151  CBC    Component Value Date/Time   WBC 12.1 (H) 10/02/2019 1125   RBC 3.67 (L) 10/02/2019 1125   HGB 10.6 (L) 10/02/2019 1125   HGB 12.2 04/13/2019 1052   HCT 32.4 (L) 10/02/2019 1125   HCT 35.9 04/13/2019 1052   PLT 366 10/02/2019 1125   PLT 366 04/13/2019 1052   MCV 88.3 10/02/2019  1125   MCV 93 04/13/2019 1052   MCH 28.9 10/02/2019 1125   MCHC 32.7 10/02/2019 1125   RDW 13.4 10/02/2019 1125   RDW 12.7 04/13/2019 1052   LYMPHSABS 2.6 04/13/2019 1052   EOSABS 0.2 04/13/2019 1052   BASOSABS 0.0 04/13/2019 1052   O POS    Chaperone: [name]    No results found for this or any previous visit (from the past 24 hour(s)).  Assessment & Plan:  1) Low-risk pregnancy G2P1002 at [redacted]w[redacted]d with an Estimated Date of Delivery: 10/05/19   2) prior cesarean not desiring TOLAC,   3 ) desires permanent sterilization. Consent signed.    Meds: No orders of the defined types were placed in this encounter.  Labs/procedures today: I personally performed the services described in this documentation, which was SCRIBED in my presence. The recorded information has been reviewed and considered accurate. It has been edited as necessary during review. Tilda Burrow, MD    Plan:  Continue routine obstetrical care  Schedule c/s scheduled for Sunday 22 9 a.m           Tubal consent signed.*2 Next visit: prefers in person

## 2019-10-04 NOTE — Transfer of Care (Signed)
Immediate Anesthesia Transfer of Care Note  Patient: Natalie Knapp  Procedure(s) Performed: CESAREAN SECTION WITH BILATERAL TUBAL LIGATION (N/A )  Patient Location: PACU  Anesthesia Type:Spinal  Level of Consciousness: awake  Airway & Oxygen Therapy: Patient Spontanous Breathing  Post-op Assessment: Report given to RN  Post vital signs: Reviewed and stable  Last Vitals:  Vitals Value Taken Time  BP 98/67 10/04/19 1049  Temp    Pulse 91 10/04/19 1052  Resp 21 10/04/19 1052  SpO2 100 % 10/04/19 1052  Vitals shown include unvalidated device data.  Last Pain:  Vitals:   10/04/19 0738  TempSrc: Oral         Complications: No complications documented.

## 2019-10-04 NOTE — Progress Notes (Signed)
RN walked in to check patient to find her standing by the bed, vaping. RN informed patient she could not vape in the hospital, it is unsafe and the campus is tobacco free. Educated patient to not stand without RN present as she had recent surgery and loss of blood which can make her faint and supervision is needed for the first time attempting to stand and ambulate. Informed charge RN and central RN about situation to help RN monitor patient and newborn for safety in future. Will continue to monitor and educate.

## 2019-10-04 NOTE — Lactation Note (Signed)
This note was copied from a baby's chart. Lactation Consultation Note  Patient Name: Natalie Knapp URKYH'C Date: 10/04/2019  9 hr term female infant. Per RN mom has declined LC services at this time.    Maternal Data    Feeding Feeding Type: Breast Milk  LATCH Score                   Interventions    Lactation Tools Discussed/Used     Consult Status      Danelle Earthly 10/04/2019, 7:22 PM

## 2019-10-04 NOTE — Anesthesia Postprocedure Evaluation (Signed)
Anesthesia Post Note  Patient: Natalie Knapp  Procedure(s) Performed: CESAREAN SECTION WITH BILATERAL TUBAL LIGATION (N/A )     Patient location during evaluation: PACU Anesthesia Type: Spinal Level of consciousness: awake Pain management: pain level controlled Vital Signs Assessment: post-procedure vital signs reviewed and stable Respiratory status: spontaneous breathing Cardiovascular status: stable Postop Assessment: no headache, no backache, spinal receding, patient able to bend at knees and no apparent nausea or vomiting Anesthetic complications: no   No complications documented.  Last Vitals:  Vitals:   10/04/19 1202 10/04/19 1220  BP: 100/70 108/69  Pulse: 90 79  Resp: 12 20  Temp:  (!) 36.4 C  SpO2: 100% 100%    Last Pain:  Vitals:   10/04/19 1220  TempSrc: Axillary  PainSc:    Pain Goal:                Epidural/Spinal Function Cutaneous sensation: Tingles (10/04/19 1202), Patient able to flex knees: Yes (10/04/19 1202), Patient able to lift hips off bed: No (10/04/19 1202), Back pain beyond tenderness at insertion site: No (10/04/19 1202), Progressively worsening motor and/or sensory loss: No (10/04/19 1202), Bowel and/or bladder incontinence post epidural: No (10/04/19 1202)  Caren Macadam

## 2019-10-04 NOTE — Anesthesia Preprocedure Evaluation (Signed)
Anesthesia Evaluation  Patient identified by MRN, date of birth, ID band Patient awake    Reviewed: Allergy & Precautions, H&P , NPO status , Patient's Chart, lab work & pertinent test results  Airway Mallampati: I  TM Distance: >3 FB Neck ROM: full    Dental no notable dental hx. (+) Teeth Intact   Pulmonary Current Smoker and Patient abstained from smoking., former smoker,    Pulmonary exam normal breath sounds clear to auscultation       Cardiovascular negative cardio ROS Normal cardiovascular exam Rhythm:Regular Rate:Normal     Neuro/Psych negative neurological ROS  negative psych ROS   GI/Hepatic Neg liver ROS, GERD  Medicated and Controlled,  Endo/Other  negative endocrine ROS  Renal/GU negative Renal ROS     Musculoskeletal Back pain   Abdominal (+) + obese,   Peds  Hematology negative hematology ROS (+)   Anesthesia Other Findings   Reproductive/Obstetrics (+) Pregnancy                             Anesthesia Physical  Anesthesia Plan  ASA: II  Anesthesia Plan: Spinal   Post-op Pain Management:    Induction:   PONV Risk Score and Plan: Ondansetron, Dexamethasone and Scopolamine patch - Pre-op  Airway Management Planned: Nasal Cannula and Natural Airway  Additional Equipment: None  Intra-op Plan:   Post-operative Plan:   Informed Consent: I have reviewed the patients History and Physical, chart, labs and discussed the procedure including the risks, benefits and alternatives for the proposed anesthesia with the patient or authorized representative who has indicated his/her understanding and acceptance.       Plan Discussed with: CRNA  Anesthesia Plan Comments:         Anesthesia Quick Evaluation

## 2019-10-04 NOTE — Brief Op Note (Addendum)
10/04/2019  10:39 AM  PATIENT:  Natalie Knapp  34 y.o. female  PRE-OPERATIVE DIAGNOSIS: Pregnancy 39 weeks prior cesarean section declining trial of labor desire for elective permanent sterilization prev c/s with request for sterilization  POST-OPERATIVE DIAGNOSIS: Same, history of prev c/s with desire for btl  PROCEDURE:  Procedure(s): CESAREAN SECTION WITH BILATERAL TUBAL LIGATION (N/A) Repeat low transverse cervical cesarean section with bilateral salpingectomy SURGEON:  Surgeon(s) and Role:    Tilda Burrow, MD - Primary  PHYSICIAN ASSISTANT: Medical intern, DO  ASSISTANTS: none   ANESTHESIA:   spinal  EBL:  250 cc   BLOOD ADMINISTERED:none  DRAINS: Urinary Catheter (Foley)   LOCAL MEDICATIONS USED:  NONE  SPECIMEN:  Source of Specimen:  Placenta to labor and delivery  DISPOSITION OF SPECIMEN:  N/A  COUNTS:  YES  TOURNIQUET:    DICTATION: .Dragon Dictation  PLAN OF CARE: Admit to inpatient   PATIENT DISPOSITION:  PACU - hemodynamically stable.   Delay start of Pharmacological VTE agent (>24hrs) due to surgical blood loss or risk of bleeding: not applicable Details of procedure: Patient was taken the operating room prepped and draped for lower abdominal surgery Foley catheter in place.  Timeout conducted and confirmed by surgical team spinal anesthesia having been utilized. Transverse lower abdominal incision previously utilized was widely excised removing a 25 cm ellipse of skin, maximum width 6 cm with underlying fatty tissue to incorporate the old cicatrix.,  And improve access to the pelvis. After sharp dissection to the fascia which was opened transversely with Mayo scissors, the patient was easily dissected off of the underlying rectus muscles and the rectus muscle split in the midline and opened with a combination of sharp and blunt dissection, with excellent hemostasis and good cleavage planes.  The inferior epigastric veins and subcutaneous fatty space  required individual point cautery of the 4 ends of the vessels  Alexis wound retractor was positioned in place, and bladder flap developed, followed by transverse uterine incision through the old lower uterine segment thinned out area.  Fetal vertex was rotated into the incision and delivered without difficulty.  Cord was clamped and baby cared for by waiting pediatricians.  See their notes.  Placenta delivered easily Fabio Asa presentation membranes intact.  Uterus was swabbed out and hemostatic.  Initially the posterior placenta was slow to release but then came out intact.  There was no suspicion of membrane remnants.  Uterus was swabbed out.  The uterus was then closed in a running locking first layer in a continuous running second layer using a single suture of 0 Monocryl.   Tubal ligation: Attention was then directed to the right fallopian tube which could be identified to its fimbriated end, elevated with Babcock clamp, thin areas in the mesosalpinx opened into fenestration, and then the blood vessels were ligated and the fallopian tube ligated proximally and underneath the fimbria.  The tube itself was amputated off and the pedicles were hemostatic.  The left side was treated similarly in both specimens passed off the surgical table.  After hemostasis in the adnexa was confirmed, inspection the bladder flap showed good hemostasis.  Laparotomy equipment was removed, the anterior peritoneum was closed with continuous running 2-0 Vicryl.  The fascia was then closed with continuous running 0 Vicryl.  Subcutaneous tissues were reapproximated using a 2 layers of horizontal mattress sutures of 2-0 plain followed by subcuticular 4-0 Vicryl closure of the skin incision.  Sponge and needle counts were correct and patient to  recovery room stable condition

## 2019-10-05 LAB — CBC
HCT: 28.7 % — ABNORMAL LOW (ref 36.0–46.0)
Hemoglobin: 9.4 g/dL — ABNORMAL LOW (ref 12.0–15.0)
MCH: 28.7 pg (ref 26.0–34.0)
MCHC: 32.8 g/dL (ref 30.0–36.0)
MCV: 87.8 fL (ref 80.0–100.0)
Platelets: 392 10*3/uL (ref 150–400)
RBC: 3.27 MIL/uL — ABNORMAL LOW (ref 3.87–5.11)
RDW: 13.4 % (ref 11.5–15.5)
WBC: 13.6 10*3/uL — ABNORMAL HIGH (ref 4.0–10.5)
nRBC: 0 % (ref 0.0–0.2)

## 2019-10-05 MED ORDER — FERROUS SULFATE 325 (65 FE) MG PO TABS
325.0000 mg | ORAL_TABLET | ORAL | Status: DC
  Administered 2019-10-05: 325 mg via ORAL
  Filled 2019-10-05 (×2): qty 1

## 2019-10-05 NOTE — Progress Notes (Signed)
CSW went to meet with [name] at bedside however [name] was getting dressed. CSW to return at a later time.    Orie Cuttino S. Ziv Welchel, MSW, LCSW [facility] [place] [phone]  

## 2019-10-05 NOTE — Discharge Summary (Signed)
Postpartum Discharge Summary       Patient Name: Natalie Knapp DOB: 22-Aug-1985 MRN: [id number]  Date of admission: 10/04/2019 Delivery date:10/04/2019  Delivering provider: Jonnie Kind  Date of discharge: 10/06/2019  Admitting diagnosis: H/O: cesarean section [Z98.891] History of elective cesarean section [Z98.891] Intrauterine pregnancy: [redacted]w[redacted]d    Secondary diagnosis:  Active Problems:   S/P primary low transverse C-section   H/O: cesarean section   Encounter for sterilization bilateral salpingectomy   History of elective cesarean section   Additional problems: Hx opiate addiction--doesn't want any narcotics   Discharge diagnosis: Term Pregnancy Delivered                                              Post partum procedures:postpartum tubal ligation Augmentation:  Complications: None  Hospital course: Sceduled C/S   34y.o. yo G2P2003 at 310w6das admitted to the hospital 10/04/2019 for scheduled cesarean section with the following indication:Elective Repeat.Delivery details are as follows:  Membrane Rupture Time/Date: 9:51 AM ,10/04/2019   Delivery Method:C-Section, Low Transverse  Details of operation can be found in separate operative note.  Patient had an uncomplicated postpartum course.  She is ambulating, tolerating a regular diet, passing flatus, and urinating well. Patient is discharged home in stable condition on  10/06/19        Newborn Data: Birth date:10/04/2019  Birth time:9:52 AM  Gender:Female  Living status:Living  Apgars:9 ,10  Weight:2914 g     Magnesium Sulfate received: No BMZ received: No Rhophylac:N/A MMR:N/A T-DaP:allergic Flu: N/A Transfusion:No  Physical exam  Vitals:   10/05/19 0121 10/05/19 0537 10/05/19 2043 10/06/19 0544  BP: 129/89 107/87 113/75 110/68  Pulse: 73 76 80 79  Resp: _0 Temp: 97.9 F (36.6 C) 98.1 F (36.7 C) (!) 97.3 F (36.3 C) 97.9 F (36.6 C)  TempSrc: Oral Oral Oral Oral  SpO2: 99% 99%     Weight:      Height:       General: alert, cooperative and no distress Lochia: appropriate Uterine Fundus: firm Incision: N/A DVT Evaluation: No evidence of DVT seen on physical exam. Negative Homan's sign. No cords or calf tenderness. Labs: Lab Results  Component Value Date   WBC 13.6 (H) 10/05/2019   HGB 9.4 (L) 10/05/2019   HCT 28.7 (L) 10/05/2019   MCV 87.8 10/05/2019   PLT 392 10/05/2019   No flowsheet data found. Edinburgh Score: Edinburgh Postnatal Depression Scale Screening Tool 10/05/2019  I have been able to laugh and see the funny side of things. (No Data)  I have looked forward with enjoyment to things. -  I have blamed myself unnecessarily when things went wrong. -  I have been anxious or worried for no good reason. -  I have felt scared or panicky for no good reason. -  Things have been getting on top of me. -  I have been so unhappy that I have had difficulty sleeping. -  I have felt sad or miserable. -  I have been so unhappy that I have been crying. -  The thought of harming myself has occurred to me. - Flavia Shipperostnatal Depression Scale Total -     After visit meds:  Allergies as of 10/06/2019      Reactions   Pertussis Vaccines Shortness Of Breath, Cough  Medication List    STOP taking these medications   PRENATAL VITAMINS PO     TAKE these medications   acetaminophen 325 MG tablet Commonly known as: Tylenol Take 2 tablets (650 mg total) by mouth every 6 (six) hours as needed for mild pain.   coconut oil Oil Apply 1 application topically as needed (nipple pain).   ferrous sulfate 325 (65 FE) MG tablet Take 1 tablet (325 mg total) by mouth every other day. Start taking on: October 07, 2019   gabapentin 100 MG capsule Commonly known as: NEURONTIN Take 1 capsule (100 mg total) by mouth 2 (two) times daily.   ibuprofen 800 MG tablet Commonly known as: ADVIL Take 1 tablet (800 mg total) by mouth every 8 (eight) hours as needed for  moderate pain or cramping.            Discharge Care Instructions  (From admission, onward)         Start     Ordered   10/06/19 0000  Leave dressing on - Keep it clean, dry, and intact until clinic visit        10/06/19 0814           Discharge home in stable condition Infant Feeding: Bottle Infant Disposition:home with mother Discharge instruction: per After Visit Summary and Postpartum booklet. Activity: Advance as tolerated. Pelvic rest for 6 weeks.  Diet: routine diet Future Appointments: Future Appointments  Date Time Provider [place]  10/12/2019 11:10 AM Jonnie Kind, MD [facility]       10/06/2019 Christin Fudge, CNM

## 2019-10-05 NOTE — Op Note (Signed)
10/04/2019  10:39 AM  PATIENT:  Natalie Knapp  34 y.o. female  PRE-OPERATIVE DIAGNOSIS: Pregnancy 39 weeks prior cesarean section declining trial of labor desire for elective permanent sterilization prev c/s with request for sterilization  POST-OPERATIVE DIAGNOSIS: Same, history of prev c/s with desire for btl  PROCEDURE:  Procedure(s): CESAREAN SECTION WITH BILATERAL TUBAL LIGATION (N/A) Repeat low transverse cervical cesarean section with bilateral salpingectomy SURGEON:  Surgeon(s) and Role:    Tilda Burrow, MD - Primary  PHYSICIAN ASSISTANT: Medical intern, DO  ASSISTANTS: none   ANESTHESIA:   spinal  EBL:  250 cc   BLOOD ADMINISTERED:none  DRAINS: Urinary Catheter (Foley)   LOCAL MEDICATIONS USED:  NONE  SPECIMEN:  Source of Specimen:  Placenta to labor and delivery  DISPOSITION OF SPECIMEN:  N/A  COUNTS:  YES  TOURNIQUET:    DICTATION: .Dragon Dictation  PLAN OF CARE: Admit to inpatient   PATIENT DISPOSITION:  PACU - hemodynamically stable.   Delay start of Pharmacological VTE agent (>24hrs) due to surgical blood loss or risk of bleeding: not applicable Details of procedure: Patient was taken the operating room prepped and draped for lower abdominal surgery Foley catheter in place.  Timeout conducted and confirmed by surgical team spinal anesthesia having been utilized. Transverse lower abdominal incision previously utilized was widely excised removing a 25 cm ellipse of skin, maximum width 6 cm with underlying fatty tissue to incorporate the old cicatrix.,  And improve access to the pelvis. After sharp dissection to the fascia which was opened transversely with Mayo scissors, the patient was easily dissected off of the underlying rectus muscles and the rectus muscle split in the midline and opened with a combination of sharp and blunt dissection, with excellent hemostasis and good cleavage planes.  The inferior epigastric veins and subcutaneous fatty space  required individual point cautery of the 4 ends of the vessels  Alexis wound retractor was positioned in place, and bladder flap developed, followed by transverse uterine incision through the old lower uterine segment thinned out area.  Fetal vertex was rotated into the incision and delivered without difficulty.  Cord was clamped and baby cared for by waiting pediatricians.  See their notes.  Placenta delivered easily Fabio Asa presentation membranes intact.  Uterus was swabbed out and hemostatic.  Initially the posterior placenta was slow to release but then came out intact.  There was no suspicion of membrane remnants.  Uterus was swabbed out.  The uterus was then closed in a running locking first layer in a continuous running second layer using a single suture of 0 Monocryl.   Tubal ligation: Attention was then directed to the right fallopian tube which could be identified to its fimbriated end, elevated with Babcock clamp, thin areas in the mesosalpinx opened into fenestration, and then the blood vessels were ligated and the fallopian tube ligated proximally and underneath the fimbria.  The tube itself was amputated off and the pedicles were hemostatic.  The left side was treated similarly in both specimens passed off the surgical table.  After hemostasis in the adnexa was confirmed, inspection the bladder flap showed good hemostasis.  Laparotomy equipment was removed, the anterior peritoneum was closed with continuous running 2-0 Vicryl.  The fascia was then closed with continuous running 0 Vicryl.  Subcutaneous tissues were reapproximated using a 2 layers of horizontal mattress sutures of 2-0 plain followed by subcuticular 4-0 Vicryl closure of the skin incision.  Sponge and needle counts were correct and patient to  recovery room stable condition   Tilda Burrow, MD //

## 2019-10-05 NOTE — Clinical Social Work Maternal (Signed)
CLINICAL SOCIAL WORK MATERNAL/CHILD NOTE  Patient Details  Name: Natalie Knapp MRN: [id number] Date of Birth: 03/24/1985  Date:  2019-06-12  Clinical Social Worker Initiating Note:  Hortencia Pilar, LCSW Date/Time: Initiated:  10/05/19/1130     Child's Name:  Natalie Knapp   Biological Parents:  Mother, Father Florentina Addison Woodfin Ganja)   Need for Interpreter:  None   Reason for Referral:  Current Substance Use/Substance Use During Pregnancy    Address:  [address] [place] [address]    Phone number:  [phone] (home)     Additional phone number: none   Household Members/Support Persons (HM/SP):   Household Member/Support Person 1, Household Member/Support Person 2, Household Member/Support Person 3   HM/SP Name Relationship DOB or Age  HM/SP -1  Maurine Simmering   [name]   07/16/1986  HM/SP -2  Anson Fret   FOB     HM/SP -3  Lucilla Lame   daughter  02/21/2015  HM/SP -4 Ashley Akin  Daughter 02/21/2015  HM/SP -5 Odette Horns  Daughter  08/30/2005  HM/SP -6        HM/SP -7        HM/SP -8          Natural Supports (not living in the home):  Friends   Professional Supports: None   Employment: Homemaker   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     Pediatrician:    [facility]  Pediatrician List:   [facility]    [place] Other ([facility]  [facility]      Pediatrician Fax Number:    Risk Factors/Current Problems:  None   Cognitive State:  Insightful , Knapp to Concentrate , Alert     Mood/Affect:  Relaxed , Comfortable , Calm , Interested , Happy    CSW Assessment: CSW consulted as [name] reported THC use in pregnancy. CSW went to speak with [name] at bedside to address further needs.   CSW congratulated [name] and FOB on the birth of infant. CSW advised [name] of the HIPPA policy in which [name] reported that it was fine for spouse to stay in the room while CSW spoke with her. CSW understanding and advised [name] of CSW's role and the reason for CSW coming to visit with her. [name] did admit that she used THC one or twice a week to help with pain and sleep. [name] expressed that she was unable to eat and had issues with sleeping. [name] reported that she used the lowest amount possible "about the size of you thumb nail". [name] expressed that she didn't want anything to happen. CSW understanding and advised [name] of the hospital drug screen policy. [name] reported that she understood that if infants UDS or CDS is positive for any substances that [name] was given or prescribed by an MD, then CSW would need to make a CPS report. [name] reported that she also took a piece of Vicodin during her pregnancy. [name] reported that she had  her wisdom tooth pulled and then had another tooth crack "so I placed a piece of that in there to get some relief". [name] reported that she has a perception for this in which CSW and Pediatrician were Knapp to very from 02/2019 per chart. [name] reported that she did this back in March when she tested positive for the Opiates. [name] reported no other medication use while pregnant. [name] also denies any CPS involvement.   [name] reported that she has no mental health hx but does report that she lost her parents 4 years ago and then was placed into grief counseling as well as started on an anti depressant. [name] reported that she didn't take this medication long. [name] expressed feelings of gladness and the desire to get home to other children as "that's what I do. 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 CSW was notified that [name] is from home with spouse and twin daughter age 4. [name] expressed that she has support from spouses family as well as a few of her friends and step mom. [name] notified CSW that she has all needed items to care for infant and reported that infant would sleep in basinet once arrived home. [name] expressed that she is planning to apply for WIC as well as Food Stamps.   CSW took time to provide [name] with PPD and SIDS education. [name] was given PPD Checklist to keep track of feelings as they relate to PPD. [name] thanked CSW and reported no other needs.   CSW will continue to monitor infants UDS and CDS and make CPS report if warranted. No barriers to d/c.   CSW Plan/Description:  No Further Intervention Required/No Barriers to Discharge, Sudden Infant Death Syndrome (SIDS) Education, Perinatal Mood and Anxiety Disorder (PMADs) Education, Hospital Drug Screen Policy Information, CSW Will Continue to Monitor Umbilical Cord Tissue Drug Screen Results and Make Report if Warranted    Tyra Michelle S Euclide Granito, LCSWA 10/05/2019, 12:12 PM 

## 2019-10-05 NOTE — Progress Notes (Signed)
POSTPARTUM PROGRESS NOTE  Subjective: Natalie Knapp is a 34 y.o. G2P2003 is POD#1  s/p rLTCS at [redacted]w[redacted]d.  She reports she doing well. No acute events overnight. She denies any problems with ambulating, voiding or po intake. Denies nausea or vomiting. She has had a bowel movement. Pain is well controlled.  Lochia is moderate.  Objective: Blood pressure 107/87, pulse 76, temperature 98.1 F (36.7 C), temperature source Oral, resp. rate 18, height 5\' 1"  (1.549 m), weight 74.8 kg, last menstrual period 01/05/2019, SpO2 99 %, unknown if currently breastfeeding.  Physical Exam:  General: alert, cooperative and no distress Chest: no respiratory distress Abdomen: soft, non-tender, incision site with honeycomb in place, blood soaked on right side  Uterine Fundus: firm and at level of umbilicus Extremities: No calf swelling or tenderness  no edema  Recent Labs    10/02/19 1125  HGB 10.6*  HCT 32.4*    Assessment/Plan: Natalie Knapp is a 34 y.o. G2P2003 who is POD#1 s/p rLTCS w BTL at [redacted]w[redacted]d.  Routine Postpartum Care: Doing well, pain well-controlled.  -- Continue routine care, lactation support  -- Contraception: s/p salpingectomy  -- Feeding: breast and bottle ---Hgb 10.6 --> 9.4. Will start PO iron.  -History of tobacco use disorder: states she is vaping now, but still has nicotine in vaping solution. NRT prn,     Dispo: Plan for discharge like POD#2 or 3   Natalie Knapp, [redacted]w[redacted]d, MD OB Fellow, [facility] 10/05/2019 5:53 AM

## 2019-10-05 NOTE — Progress Notes (Signed)
Informed first call that pts dressing became saturated and fell off in the shower. Order received to apply new dressing.

## 2019-10-06 LAB — SURGICAL PATHOLOGY

## 2019-10-06 MED ORDER — COCONUT OIL OIL: 1.0000 "application " | TOPICAL_OIL | 0 refills | Status: DC | PRN

## 2019-10-06 MED ORDER — GABAPENTIN 100 MG PO CAPS: 100.0000 mg | ORAL_CAPSULE | Freq: Two times a day (BID) | ORAL | 0 refills | Status: DC

## 2019-10-06 MED ORDER — FERROUS SULFATE 325 (65 FE) MG PO TABS: 325.0000 mg | ORAL_TABLET | ORAL | 1 refills | Status: DC

## 2019-10-06 MED ORDER — IBUPROFEN 800 MG PO TABS: 800.0000 mg | ORAL_TABLET | Freq: Three times a day (TID) | ORAL | 0 refills | Status: DC | PRN

## 2019-10-06 MED ORDER — ACETAMINOPHEN 325 MG PO TABS: 650.0000 mg | ORAL_TABLET | Freq: Four times a day (QID) | ORAL | 2 refills | Status: DC | PRN

## 2019-10-06 NOTE — Discharge Instructions (Signed)
Cesarean Delivery, Care After This sheet gives you information about how to care for yourself after your procedure. Your health care provider may also give you more specific instructions. If you have problems or questions, contact your health care provider. What can I expect after the procedure? After the procedure, it is common to have:  A small amount of blood or clear fluid coming from the incision.  Some redness, swelling, and pain in your incision area.  Some abdominal pain and soreness.  Vaginal bleeding (lochia). Even though you did not have a vaginal delivery, you will still have vaginal bleeding and discharge.  Pelvic cramps.  Fatigue. You may have pain, swelling, and discomfort in the tissue between your vagina and your anus (perineum) if:  Your C-section was unplanned, and you were allowed to labor and push.  An incision was made in the area (episiotomy) or the tissue tore during attempted vaginal delivery. Follow these instructions at home: Incision care   Follow instructions from your health care provider about how to take care of your incision. Make sure you: ? Wash your hands with soap and water before you change your bandage (dressing). If soap and water are not available, use hand sanitizer. ? If you have a dressing, change it or remove it as told by your health care provider. ? Leave stitches (sutures), skin staples, skin glue, or adhesive strips in place. These skin closures may need to stay in place for 2 weeks or longer. If adhesive strip edges start to loosen and curl up, you may trim the loose edges. Do not remove adhesive strips completely unless your health care provider tells you to do that.  Check your incision area every day for signs of infection. Check for: ? More redness, swelling, or pain. ? More fluid or blood. ? Warmth. ? Pus or a bad smell.  Do not take baths, swim, or use a hot tub until your health care provider says it's okay. Ask your health  care provider if you can take showers.  When you cough or sneeze, hug a pillow. This helps with pain and decreases the chance of your incision opening up (dehiscing). Do this until your incision heals. Medicines  Take over-the-counter and prescription medicines only as told by your health care provider.  If you were prescribed an antibiotic medicine, take it as told by your health care provider. Do not stop taking the antibiotic even if you start to feel better.  Do not drive or use heavy machinery while taking prescription pain medicine. Lifestyle  Do not drink alcohol. This is especially important if you are breastfeeding or taking pain medicine.  Do not use any products that contain nicotine or tobacco, such as cigarettes, e-cigarettes, and chewing tobacco. If you need help quitting, ask your health care provider. Eating and drinking  Drink at least 8 eight-ounce glasses of water every day unless told not to by your health care provider. If you breastfeed, you may need to drink even more water.  Eat high-fiber foods every day. These foods may help prevent or relieve constipation. High-fiber foods include: ? Whole grain cereals and breads. ? Brown rice. ? Beans. ? Fresh fruits and vegetables. Activity   If possible, have someone help you care for your baby and help with household activities for at least a few days after you leave the hospital.  Return to your normal activities as told by your health care provider. Ask your health care provider what activities are safe for  you.  Rest as much as possible. Try to rest or take a nap while your baby is sleeping.  Do not lift anything that is heavier than 10 lbs (4.5 kg), or the limit that you were told, until your health care provider says that it is safe.  Talk with your health care provider about when you can engage in sexual activity. This may depend on your: ? Risk of infection. ? How fast you heal. ? Comfort and desire to  engage in sexual activity. General instructions  Do not use tampons or douches until your health care provider approves.  Wear loose, comfortable clothing and a supportive and well-fitting bra.  Keep your perineum clean and dry. Wipe from front to back when you use the toilet.  If you pass a blood clot, save it and call your health care provider to discuss. Do not flush blood clots down the toilet before you get instructions from your health care provider.  Keep all follow-up visits for you and your baby as told by your health care provider. This is important. Contact a health care provider if:  You have: ? A fever. ? Bad-smelling vaginal discharge. ? Pus or a bad smell coming from your incision. ? Difficulty or pain when urinating. ? A sudden increase or decrease in the frequency of your bowel movements. ? More redness, swelling, or pain around your incision. ? More fluid or blood coming from your incision. ? A rash. ? Nausea. ? Little or no interest in activities you used to enjoy. ? Questions about caring for yourself or your baby.  Your incision feels warm to the touch.  Your breasts turn red or become painful or hard.  You feel unusually sad or worried.  You vomit.  You pass a blood clot from your vagina.  You urinate more than usual.  You are dizzy or light-headed. Get help right away if:  You have: ? Pain that does not go away or get better with medicine. ? Chest pain. ? Difficulty breathing. ? Blurred vision or spots in your vision. ? Thoughts about hurting yourself or your baby. ? New pain in your abdomen or in one of your legs. ? A severe headache.  You faint.  You bleed from your vagina so much that you fill more than one sanitary pad in one hour. Bleeding should not be heavier than your heaviest period. Summary  After the procedure, it is common to have pain at your incision site, abdominal cramping, and slight bleeding from your vagina.  Check  your incision area every day for signs of infection.  Tell your health care provider about any unusual symptoms.  Keep all follow-up visits for you and your baby as told by your health care provider. This information is not intended to replace advice given to you by your health care provider. Make sure you discuss any questions you have with your health care provider. Document Revised: 08/07/2017 Document Reviewed: 08/07/2017 Elsevier Patient Education  2020 [employer].   Salpingectomy, Care After This sheet gives you information about how to care for yourself after your procedure. Your health care provider may also give you more specific instructions. If you have problems or questions, contact your health care provider. What can I expect after the procedure? After the procedure, it is common to have:  Pain in your abdomen.  Some light vaginal bleeding (spotting) for a few days.  Tiredness. Your recovery time will vary depending on which method your surgeon used  for your surgery. Follow these instructions at home: Incision care   Follow instructions from your health care provider about how to take care of your incisions. Make sure you: ? Wash your hands with soap and water before and after you change your bandage (dressing). If soap and water are not available, use hand sanitizer. ? Change or remove your dressing as told by your health care provider. ? Leave any stitches (sutures), skin glue, or adhesive strips in place. These skin closures may need to stay in place for 2 weeks or longer. If adhesive strip edges start to loosen and curl up, you may trim the loose edges. Do not remove adhesive strips completely unless your health care provider tells you to do that.  Keep your dressing clean and dry.  Check your incision area every day for signs of infection. Check for: ? Redness, swelling, or pain that gets worse. ? Fluid or blood. ? Warmth. ? Pus or a bad  smell. Activity  Rest as told by your health care provider.  Avoid sitting for a long time without moving. Get up to take short walks every 1-2 hours. This is important to improve blood flow and breathing. Ask for help if you feel weak or unsteady.  Return to your normal activities as told by your health care provider. Ask your health care provider what activities are safe for you.  Do not drive until your health care provider says that it is safe.  Do not lift anything that is heavier than 10 lb (4.5 kg), or the limit that you are told, until your health care provider says that it is safe. This may be 2-6 weeks depending on your surgery.  Until your health care provider approves: ? Do not douche. ? Do not use tampons. ? Do not have sex. Medicines  Take over-the-counter and prescription medicines only as told by your health care provider.  Ask your health care provider if the medicine prescribed to you: ? Requires you to avoid driving or using heavy machinery. ? Can cause constipation. You may need to take actions to prevent or treat constipation, such as:  Drink enough fluid to keep your urine pale yellow.  Take over-the-counter or prescription medicines.  Eat foods that are high in fiber, such as beans, whole grains, and fresh fruits and vegetables.  Limit foods that are high in fat and processed sugars, such as fried or sweet foods. General instructions  Wear compression stockings as told by your health care provider. These stockings help to prevent blood clots and reduce swelling in your legs.  Do not use any products that contain nicotine or tobacco, such as cigarettes, e-cigarettes, and chewing tobacco. If you need help quitting, ask your health care provider.  Do not take baths, swim, or use a hot tub until your health care provider approves. You may take showers.  Keep all follow-up visits as told by your health care provider. This is important. Contact a health care  provider if you have:  Pain when you urinate.  Redness, swelling, or pain around an incision.  Fluid or blood coming from an incision.  Pus or a bad smell coming from an incision.  An incision that feels warm to the touch.  A fever.  Abdominal pain that gets worse or does not get better with medicine.  An incision that starts to break open.  A rash.  Light-headedness.  Nausea and vomiting. Get help right away if you:  Have pain in your  chest or leg.  Develop shortness of breath.  Faint.  Have increased or heavy vaginal bleeding, such as soaking a pad in an hour. Summary  After the procedure, it is common to feel tired, have some pain in your abdomen, and have some light vaginal bleeding for a few days.  Follow instructions from your health care provider about how to take care of your incisions.  Return to your normal activities as told by your health care provider. Ask your health care provider what activities are safe for you.  Do not douche, use tampons, or have sex until your health care provider approves.  Keep all follow-up visits as told by your health care provider. This information is not intended to replace advice given to you by your health care provider. Make sure you discuss any questions you have with your health care provider. Document Revised: 01/20/2018 Document Reviewed: 01/20/2018 Elsevier Patient Education  2020 [employer].

## 2019-10-07 ENCOUNTER — Telehealth: Payer: Self-pay | Admitting: *Deleted

## 2019-10-07 ENCOUNTER — Telehealth: Payer: Self-pay | Admitting: Obstetrics and Gynecology

## 2019-10-07 NOTE — Telephone Encounter (Signed)
RPR was not performed, does not have blood sample for patient. Patient is no longer at hospital. Caller was not sure if this was a duplicate order just needed some clarification on this patient.

## 2019-10-07 NOTE — Telephone Encounter (Signed)
Natalie Knapp at [facility] states RPR was not done.  Informed patient had very limited prenatal care. Stated they just wanted to let us know.

## 2019-10-12 ENCOUNTER — Encounter: Payer: Medicaid Other | Admitting: Obstetrics and Gynecology

## 2019-10-13 ENCOUNTER — Ambulatory Visit (INDEPENDENT_AMBULATORY_CARE_PROVIDER_SITE_OTHER): Payer: Medicaid Other | Admitting: Women's Health

## 2019-10-13 ENCOUNTER — Encounter: Payer: Self-pay | Admitting: Women's Health

## 2019-10-13 VITALS — BP 106/78 | HR 103 | Ht 61.0 in | Wt 165.0 lb

## 2019-10-13 DIAGNOSIS — Z4889 Encounter for other specified surgical aftercare: Secondary | ICD-10-CM | POA: Diagnosis not present

## 2019-10-13 NOTE — Progress Notes (Signed)
   GYN VISIT Patient name: Natalie Knapp MRN [id number]  Date of birth: 05/22/1985 Chief Complaint:   Routine Post Op  History of Present Illness:   Natalie Knapp is a 34 y.o. G60P2003 Caucasian female 9d s/p RCS w/ bilateral salpingectomy being seen today for incision check. Doing well, no complaints. Bottlefeeding. No PPD/anxiety, EPDS 0.    Depression screen [facility] 2/9 04/13/2019  Decreased Interest 0  Down, Depressed, Hopeless 0  PHQ - 2 Score 0  Altered sleeping 0  Tired, decreased energy 0  Change in appetite 1  Feeling bad or failure about yourself  0  Trouble concentrating 0  Moving slowly or fidgety/restless 0  Suicidal thoughts 0  PHQ-9 Score 1  Difficult doing work/chores Not difficult at all    No LMP recorded. Review of Systems:   Pertinent items are noted in HPI Denies fever/chills, dizziness, headaches, visual disturbances, fatigue, shortness of breath, chest pain, abdominal pain, vomiting, abnormal vaginal discharge/itching/odor/irritation, problems with periods, bowel movements, urination, or intercourse unless otherwise stated above.  Pertinent History Reviewed:  Reviewed past medical,surgical, social, obstetrical and family history.  Reviewed problem list, medications and allergies. Physical Assessment:   Vitals:   10/13/19 1009  BP: 106/78  Pulse: (!) 103  Weight: 165 lb (74.8 kg)  Height: 5\' 1"  (1.549 m)  Body mass index is 31.18 kg/m.       Physical Examination:   General appearance: alert, well appearing, and in no distress  Mental status: alert, oriented to person, place, and time  Skin: warm & dry   Cardiovascular: normal heart rate noted  Respiratory: normal respiratory effort, no distress  Abdomen: soft, non-tender, honeycomb dressing and steri-strips removed, incision healing well, no s/s infection  Pelvic: examination not indicated  Extremities: no edema   Chaperone: n/a    No results found for this or any previous visit (from the past 24  hour(s)).  Assessment & Plan:  1) 9d s/p RCS w/ bilateral salpingectomy> bottlefeeding  2) Incision check> healing well  Meds: No orders of the defined types were placed in this encounter.   No orders of the defined types were placed in this encounter.   Return in about 4 weeks (around 11/10/2019) for pp visit in person.  11/12/2019 CNM, [facility] 10/13/2019 10:25 AM

## 2019-11-10 ENCOUNTER — Ambulatory Visit: Payer: Medicaid Other | Admitting: Women's Health

## 2020-10-01 ENCOUNTER — Emergency Department (HOSPITAL_COMMUNITY): Payer: Medicaid Other

## 2020-10-01 ENCOUNTER — Encounter (HOSPITAL_COMMUNITY): Payer: Self-pay | Admitting: *Deleted

## 2020-10-01 ENCOUNTER — Emergency Department (HOSPITAL_COMMUNITY)
Admission: EM | Admit: 2020-10-01 | Discharge: 2020-10-01 | Disposition: A | Discharge status: Home / Self Care | Payer: Medicaid Other | Attending: Student | Admitting: Student

## 2020-10-01 ENCOUNTER — Other Ambulatory Visit: Payer: Self-pay

## 2020-10-01 DIAGNOSIS — W1842XA Slipping, tripping and stumbling without falling due to stepping into hole or opening, initial encounter: Secondary | ICD-10-CM | POA: Diagnosis not present

## 2020-10-01 DIAGNOSIS — M7989 Other specified soft tissue disorders: Secondary | ICD-10-CM | POA: Insufficient documentation

## 2020-10-01 DIAGNOSIS — Z87891 Personal history of nicotine dependence: Secondary | ICD-10-CM | POA: Diagnosis not present

## 2020-10-01 DIAGNOSIS — S93401A Sprain of unspecified ligament of right ankle, initial encounter: Secondary | ICD-10-CM | POA: Insufficient documentation

## 2020-10-01 DIAGNOSIS — S99911A Unspecified injury of right ankle, initial encounter: Secondary | ICD-10-CM | POA: Diagnosis present

## 2020-10-01 NOTE — ED Provider Notes (Addendum)
[facility] EMERGENCY DEPARTMENT Provider Note   CSN: [id number] Arrival date & time: 10/01/20  1100     History Chief Complaint  Patient presents with   Ankle Pain    Right    Natalie Knapp is a 35 y.o. female.  The history is provided by the patient. No language interpreter was used.  Ankle Pain Location:  Ankle Ankle location:  R ankle Pain details:    Quality:  Aching   Radiates to:  Does not radiate   Progression:  Worsening Chronicity:  New Relieved by:  Nothing Worsened by:  Nothing Ineffective treatments:  None tried Risk factors: no concern for non-accidental trauma   Pt reports she stepped in a hole and turned her ankle.  Pt complains of swelling and pain    Past Medical History:  Diagnosis Date   GERD (gastroesophageal reflux disease)     Patient Active Problem List   Diagnosis Date Noted   Encounter for sterilization bilateral salpingectomy 10/04/2019   GBS bacteriuria 04/15/2019   Encounter for supervision of other normal pregnancy, unspecified trimester 04/13/2019   Smoker 04/13/2019   S/P primary low transverse C-section 02/21/2015   Marijuana use 08/12/2014    Past Surgical History:  Procedure Laterality Date   CESAREAN SECTION     CESAREAN SECTION MULTI-GESTATIONAL  02/21/2015   Procedure: CESAREAN SECTION MULTI-GESTATIONAL;  Surgeon: Tilda Burrow, MD;  Location: [facility] ORS;  Service: Obstetrics;;   CESAREAN SECTION WITH BILATERAL TUBAL LIGATION N/A 10/04/2019   Procedure: CESAREAN SECTION WITH BILATERAL TUBAL LIGATION;  Surgeon: Tilda Burrow, MD;  Location: [facility] LD ORS;  Service: Obstetrics;  Laterality: N/A;     OB History     Gravida  2   Para  2   Term  2   Preterm      AB      Living  3      SAB      IAB      Ectopic      Multiple  1   Live Births  3           Family History  Problem Relation Age of Onset   Scoliosis Mother    Arthritis Mother    Other Mother        blood clot   Cancer Father         colon, lung, skin   Heart disease Father    Heart disease Maternal Grandmother    Stroke Maternal Grandmother    Thyroid disease Maternal Grandmother    Hypertension Maternal Grandmother    Diabetes Maternal Grandfather    Heart disease Maternal Grandfather    Heart disease Paternal Grandmother    Diabetes Paternal Grandmother    Dementia Paternal Grandmother    Cancer Paternal Grandfather        melanoma   Diabetes Maternal Uncle     Social History   Tobacco Use   Smoking status: Former    Packs/day: 0.25    Types: Cigarettes    Quit date: 08/04/2019    Years since quitting: 1.1   Smokeless tobacco: Never  Vaping Use   Vaping Use: Every day  Substance Use Topics   Alcohol use: Yes    Alcohol/week: 2.0 standard drinks    Types: 2 Glasses of wine per week   Drug use: Not Currently    Comment: Pt states that she stopped smoking marijuana 2 weeks ago 01/10/15    Home Medications Prior to Admission  medications   Medication Sig Start Date End Date Taking? Authorizing Provider  ferrous sulfate 325 (65 FE) MG tablet Take 1 tablet (325 mg total) by mouth every other day. 10/07/19   Cresenzo-Dishmon, Scarlette Calico, CNM  gabapentin (NEURONTIN) 100 MG capsule Take 1 capsule (100 mg total) by mouth 2 (two) times daily. 10/06/19   Cresenzo-Dishmon, Scarlette Calico, CNM  ibuprofen (ADVIL) 800 MG tablet Take 1 tablet (800 mg total) by mouth every 8 (eight) hours as needed for moderate pain or cramping. 10/06/19   Cresenzo-Dishmon, Scarlette Calico, CNM    Allergies    Pertussis vaccines  Review of Systems   Review of Systems  All other systems reviewed and are negative.  Physical Exam Updated Vital Signs BP 127/80 (BP Location: Right Arm)   Pulse 88   Temp 98.7 F (37.1 C) (Oral)   Resp 18   Ht 5\' 1"  (1.549 m)   LMP 09/06/2020 (Exact Date)   SpO2 100%   Breastfeeding No   BMI 31.18 kg/m   Physical Exam Vitals reviewed.  Constitutional:      Appearance: Normal appearance.  Musculoskeletal:         General: Swelling and tenderness present.  Skin:    General: Skin is warm.  Neurological:     General: No focal deficit present.     Mental Status: She is alert.  Psychiatric:        Mood and Affect: Mood normal.    ED Results / Procedures / Treatments   Labs (all labs ordered are listed, but only abnormal results are displayed) Labs Reviewed - No data to display  EKG None  Radiology DG Ankle Complete Right  Result Date: 10/01/2020 CLINICAL DATA:  Right ankle pain after stepping in a hole yesterday. EXAM: RIGHT ANKLE - COMPLETE 3+ VIEW COMPARISON:  None. FINDINGS: No fracture or bone lesion. Ankle joint normally spaced and aligned.  No arthropathic changes. Small plantar calcaneal spur. Anterolateral soft tissue swelling. IMPRESSION: No fracture or dislocation. Electronically Signed   By: 10/03/2020 M.D.   On: 10/01/2020 13:00    Procedures Procedures   Medications Ordered in ED Medications - No data to display  ED Course  I have reviewed the triage vital signs and the nursing notes.  Pertinent labs & imaging results that were available during my care of the patient were reviewed by me and considered in my medical decision making (see chart for details).    MDM Rules/Calculators/A&P                           MDM:  Xray  right ankle  no fracture.  Pt placed in an aso and given cruthes.  Pt advised to follow up with Orthopaedist if symptoms persist Final Clinical Impression(s) / ED Diagnoses Final diagnoses:  Sprain of right ankle, unspecified ligament, initial encounter    Rx / DC Orders ED Discharge Orders     None     An After Visit Summary was printed and given to the patient.    10/03/2020, PA-C 10/01/20 1417    10/03/20 10/01/20 1417    10/03/20, MD 10/01/20 [phone]

## 2020-10-01 NOTE — Discharge Instructions (Addendum)
Return if any problems.

## 2021-05-03 ENCOUNTER — Encounter: Payer: Self-pay | Admitting: Nurse Practitioner

## 2021-05-03 ENCOUNTER — Ambulatory Visit: Payer: Medicaid Other | Admitting: Nurse Practitioner

## 2021-05-03 VITALS — BP 95/67 | HR 94 | Temp 98.9°F | Ht 61.0 in | Wt 204.0 lb

## 2021-05-03 DIAGNOSIS — Z7689 Persons encountering health services in other specified circumstances: Secondary | ICD-10-CM

## 2021-05-03 DIAGNOSIS — L309 Dermatitis, unspecified: Secondary | ICD-10-CM | POA: Diagnosis not present

## 2021-05-03 DIAGNOSIS — R110 Nausea: Secondary | ICD-10-CM

## 2021-05-03 DIAGNOSIS — A09 Infectious gastroenteritis and colitis, unspecified: Secondary | ICD-10-CM

## 2021-05-03 MED ORDER — ONDANSETRON HCL 4 MG PO TABS: 4.0000 mg | ORAL_TABLET | Freq: Three times a day (TID) | ORAL | 0 refills | Status: AC | PRN

## 2021-05-03 MED ORDER — LOPERAMIDE HCL 2 MG PO TABS: 2.0000 mg | ORAL_TABLET | Freq: Four times a day (QID) | ORAL | 0 refills | Status: AC | PRN

## 2021-05-03 MED ORDER — TRIAMCINOLONE ACETONIDE 0.1 % EX CREA: 1.0000 "application " | TOPICAL_CREAM | Freq: Two times a day (BID) | CUTANEOUS | 1 refills | Status: AC

## 2021-05-03 NOTE — Patient Instructions (Signed)
Eczema ?Eczema refers to a group of skin conditions that cause skin to become rough and inflamed. Each type of eczema has different triggers, symptoms, and treatments. Eczema of any type is usually itchy. Symptoms range from mild to severe. ?Eczema is not spread from person to person (is not contagious). It can appear on different parts of the body at different times. One person's eczema may look different from another person's eczema. ?What are the causes? ?The exact cause of this condition is not known. However, exposure to certain environmental factors, irritants, and allergens can make the condition worse. ?What are the signs or symptoms? ?Symptoms of this condition depend on the type of eczema you have. The types include: ?Contact dermatitis. There are two kinds: ?Irritant contact dermatitis. This happens when something irritates the skin and causes a rash. ?Allergic contact dermatitis. This happens when your skin comes in contact with something you are allergic to (allergens). This can include poison ivy, chemicals, or medicines that were applied to your skin. ?Atopic dermatitis. This is a long-term (chronic) skin disease that keeps coming back (recurring). It is the most common type of eczema. Usual symptoms are a red rash and itchy, dry, scaly skin. It usually starts showing signs in infancy and can last through adulthood. ?Dyshidrotic eczema. This is a form of eczema on the hands and feet. It shows up as very itchy, fluid-filled blisters. It can affect people of any age but is more common before age 75. ?Hand eczema. This causes very itchy areas of skin on the palms and sides of the hands and fingers. This type of eczema is common in industrial jobs where you may be exposed to different types of irritants. ?Lichen simplex chronicus. This type of eczema occurs when a person constantly scratches one area of the body. Repeated scratching of the area leads to thickened skin (lichenification). This condition can  accompany other types of eczema. It is more common in adults but may also be seen in children. ?Nummular eczema. This is a common type of eczema that most often affects the lower legs and the backs of the hands. It typically causes an itchy, red, circular, crusty lesion (plaque). Scratching may become a habit and can cause bleeding. Nummular eczema occurs most often in middle-aged or older people. ?Seborrheic dermatitis. This is a common skin disease that mainly affects the scalp. It may also affect other oily areas of the body, such as the face, sides of the nose, eyebrows, ears, eyelids, and chest. It is marked by small scaling and redness of the skin (erythema). This can affect people of all ages. In infants, this condition is called cradle cap. ?Stasis dermatitis. This is a common skin disease that can cause itching, scaling, and hyperpigmentation, usually on the legs and feet. It occurs most often in people who have a condition that prevents blood from being pumped through the veins in the legs (chronic venous insufficiency). Stasis dermatitis is a chronic condition that needs long-term management. ?How is this diagnosed? ?This condition may be diagnosed based on: ?A physical exam of your skin. ?Your medical history. ?Skin patch tests. These tests involve using patches that contain possible allergens and placing them on your back. Your health care provider will check in a few days to see if an allergic reaction occurred. ?How is this treated? ?Treatment for eczema is based on the type of eczema you have. You may be given hydrocortisone steroid medicine or antihistamines. These can relieve itching quickly and help reduce inflammation.  These may be prescribed or purchased over the counter, depending on the strength that is needed. ?Follow these instructions at home: ?Take or apply over-the-counter and prescription medicines only as told by your health care provider. ?Use creams or ointments to moisturize your  skin. Do not use lotions. ?Learn what triggers or irritates your symptoms so you can avoid these things. ?Treat symptom flare-ups quickly. ?Do not scratch your skin. This can make your rash worse. ?Keep all follow-up visits. This is important. ?Where to find more information ?American Academy of Dermatology: MarketingSheets.si ?National Eczema Association: nationaleczema.org ?The Society for Pediatric Dermatology: pedsderm.net ?Contact a health care provider if: ?You have severe itching, even with treatment. ?You scratch your skin regularly until it bleeds. ?Your rash looks different than usual. ?Your skin is painful, swollen, or more red than usual. ?You have a fever. ?Summary ?Eczema refers to a group of skin conditions that cause skin to become rough and inflamed. Each type has different triggers. ?Eczema of any type causes itching that may range from mild to severe. ?Treatment varies based on the type of eczema you have. Hydrocortisone steroid medicine or antihistamines can help with itching and inflammation. ?Protecting your skin is the best way to prevent eczema. Use creams or ointments to moisturize your skin. Avoid triggers and irritants. Treat flare-ups quickly. ?This information is not intended to replace advice given to you by your health care provider. Make sure you discuss any questions you have with your health care provider. ?Document Revised: 11/09/2019 Document Reviewed: 11/09/2019 ?Elsevier Patient Education ? 2022 [employer]. ?Diarrhea, Adult ?Diarrhea is when you pass loose and watery poop (stool) often. Diarrhea can make you feel weak and cause you to lose water in your body (get dehydrated). Losing water in your body can cause you to: ?Feel tired and thirsty. ?Have a dry mouth. ?Go pee (urinate) less often. ?Diarrhea often lasts 2-3 days. However, it can last longer if it is a sign of something more serious. It is important to treat your diarrhea as told by your doctor. ?Follow these instructions at  home: ?Eating and drinking ?  ?Follow these instructions as told by your doctor: ?Take an ORS (oral rehydration solution). This is a drink that helps you replace fluids and minerals your body lost. It is sold at pharmacies and stores. ?Drink plenty of fluids, such as: ?Water. ?Ice chips. ?Diluted fruit juice. ?Low-calorie sports drinks. ?Milk, if you want. ?Avoid drinking fluids that have a lot of sugar or caffeine in them. ?Eat bland, easy-to-digest foods in small amounts as you are able. These foods include: ?Bananas. ?Applesauce. ?Rice. ?Low-fat (lean) meats. ?Toast. ?Crackers. ?Avoid alcohol. ?Avoid spicy or fatty foods. ? ?Medicines ?Take over-the-counter and prescription medicines only as told by your doctor. ?If you were prescribed an antibiotic medicine, take it as told by your doctor. Do not stop using the antibiotic even if you start to feel better. ?General instructions ? ?Wash your hands often using soap and water. If soap and water are not available, use a hand sanitizer. Others in your home should wash their hands as well. Hands should be washed: ?After using the toilet or changing a diaper. ?Before preparing, cooking, or serving food. ?While caring for a sick person. ?While visiting someone in a hospital. ?Drink enough fluid to keep your pee (urine) pale yellow. ?Rest at home while you get better. ?Take a warm bath to help with any burning or pain from having diarrhea. ?Watch your condition for any changes. ?Keep all follow-up  visits as told by your doctor. This is important. ?Contact a doctor if: ?You have a fever. ?Your diarrhea gets worse. ?You have new symptoms. ?You cannot keep fluids down. ?You feel light-headed or dizzy. ?You have a headache. ?You have muscle cramps. ?Get help right away if: ?You have chest pain. ?You feel very weak or you pass out (faint). ?You have bloody or black poop or poop that looks like tar. ?You have very bad pain, cramping, or bloating in your belly (abdomen). ?You  have trouble breathing or you are breathing very quickly. ?Your heart is beating very quickly. ?Your skin feels cold and clammy. ?You feel confused. ?You have signs of losing too much water in your body, suc

## 2021-05-03 NOTE — Progress Notes (Signed)
? ? ? ?New Patient Note ? ?RE: Natalie Knapp MRN: [id number] DOB: 05/25/1985 ?Date of Office Visit: 05/03/2021 ? ?Chief Complaint: New Patient (Initial Visit) (GI concerns) ? ?History of Present Illness: ?Patient is a 36 year old female who presents to clinic to establish care with concerns of GI symptoms and eczema. ? ?Diarrhea: Patient complains of diarrhea. Onset of diarrhea was several days ago. Diarrhea is occurring approximately a few times per day. Patient describes diarrhea as watery. Diarrhea has been associated with vomiting occurring few times.  Patient denies blood in stool, fever, recent antibiotic use, recent camping.  Previous visits for diarrhea: none. Evaluation to date: none. Treatment to date: None  ? ? ?Eczema: Patient complains of rash. Onset of symptoms several years ago, and have been unchanged since that time.  Risk factors include constant hand washing. Treatment modalities that have been used in the past include: OTC moisturizers  ? ? ?Assessment and Plan: ?Natalie Knapp is a 36 y.o. female new to practice establishing care.  Patient is assessed for GI symptoms which presents a stomach flu and eczema bilateral hand. ? ?-Advised to use medication as prescribed ?-Keep hands moisturized ?-Avoid direct contact with hot water ?-Oat meal soak ?-Triamcinolone cream ? ? ?No problem-specific Assessment & Plan notes found for this encounter. ? ?Return if symptoms worsen or fail to improve. ? ? ?Diagnostics: ? ? ?Past Medical History: ?Patient Active Problem List  ? Diagnosis Date Noted  ? Encounter for sterilization bilateral salpingectomy 10/04/2019  ? GBS bacteriuria 04/15/2019  ? Encounter for supervision of other normal pregnancy, unspecified trimester 04/13/2019  ? Smoker 04/13/2019  ? S/P primary low transverse C-section 02/21/2015  ? Marijuana use 08/12/2014  ? ?Past Medical History:  ?Diagnosis Date  ? Allergy   ? GERD (gastroesophageal reflux disease)   ? ?Past Surgical History: ?Past Surgical  History:  ?Procedure Laterality Date  ? CESAREAN SECTION    ? CESAREAN SECTION MULTI-GESTATIONAL  02/21/2015  ? Procedure: CESAREAN SECTION MULTI-GESTATIONAL;  Surgeon: Tilda Burrow, MD;  Location: [facility];  Service: Obstetrics;;  ? CESAREAN SECTION WITH BILATERAL TUBAL LIGATION N/A 10/04/2019  ? Procedure: CESAREAN SECTION WITH BILATERAL TUBAL LIGATION;  Surgeon: Tilda Burrow, MD;  Location: [facility];  Service: Obstetrics;  Laterality: N/A;  ? ?Medication List:  ?Current Outpatient Medications  ?Medication Sig Dispense Refill  ? loperamide (IMODIUM A-D) 2 MG tablet Take 1 tablet (2 mg total) by mouth 4 (four) times daily as needed for diarrhea or loose stools. 30 tablet 0  ? ondansetron (ZOFRAN) 4 MG tablet Take 1 tablet (4 mg total) by mouth every 8 (eight) hours as needed for nausea or vomiting. 20 tablet 0  ? triamcinolone cream (KENALOG) 0.1 % Apply 1 application. topically 2 (two) times daily. 30 g 1  ? ?No current facility-administered medications for this visit.  ? ?Allergies: ?Allergies  ?Allergen Reactions  ? Pertussis Vaccines Shortness Of Breath and Cough  ? ?Social History: ?Social History  ? ?Socioeconomic History  ? Marital status: Married  ?  Spouse name: Not on file  ? Number of children: 2  ? Years of education: Not on file  ? Highest education level: Not on file  ?Occupational History  ? Not on file  ?Tobacco Use  ? Smoking status: Former  ?  Packs/day: 0.25  ?  Types: Cigarettes  ?  Quit date: 08/04/2019  ?  Years since quitting: 1.7  ? Smokeless tobacco: Never  ?Vaping Use  ? Vaping Use: Every  day  ?Substance and Sexual Activity  ? Alcohol use: Yes  ?  Alcohol/week: 2.0 standard drinks  ?  Types: 2 Glasses of wine per week  ? Drug use: Yes  ?  Types: Marijuana  ? Sexual activity: Yes  ?  Birth control/protection: None  ?Other Topics Concern  ? Not on file  ?Social History Narrative  ? Not on file  ? ?Social Determinants of Health  ? ?Financial Resource Strain: Not on file  ?Food Insecurity:  Not on file  ?Transportation Needs: Not on file  ?Physical Activity: Not on file  ?Stress: Not on file  ?Social Connections: Not on file  ? ? ? ? ? ?Family History: ?Family History  ?Problem Relation Age of Onset  ? Scoliosis Mother   ? Arthritis Mother   ? Other Mother   ?     blood clot  ? Cancer Father   ?     colon, lung, skin  ? Heart disease Father   ? Heart disease Maternal Grandmother   ? Stroke Maternal Grandmother   ? Thyroid disease Maternal Grandmother   ? Hypertension Maternal Grandmother   ? Diabetes Maternal Grandfather   ? Heart disease Maternal Grandfather   ? Heart disease Paternal Grandmother   ? Diabetes Paternal Grandmother   ? Dementia Paternal Grandmother   ? Cancer Paternal Grandfather   ?     melanoma  ? Diabetes Maternal Uncle   ? ?     ? ?Review of Systems  ?Constitutional: Negative.   ?HENT: Negative.    ?Eyes: Negative.   ?Respiratory: Negative.    ?Cardiovascular: Negative.   ?Gastrointestinal: Negative.   ?Musculoskeletal: Negative.   ?Skin:  Positive for rash.  ?All other systems reviewed and are negative. ? ? ?Objective: ?BP 95/67   Pulse 94   Temp 98.9 ?F (37.2 ?C)   Ht 5\' 1"  (1.549 m)   Wt 204 lb (92.5 kg)   LMP 04/12/2021 (Exact Date)   SpO2 96%   BMI 38.55 kg/m?  ?Body mass index is 38.55 kg/m?06/12/2021 ?Physical Exam ?Vitals and nursing note reviewed.  ?Constitutional:   ?   Appearance: She is obese.  ?HENT:  ?   Head: Normocephalic.  ?   Right Ear: External ear normal.  ?   Left Ear: External ear normal.  ?   Nose: Nose normal.  ?   Mouth/Throat:  ?   Mouth: Mucous membranes are moist.  ?   Pharynx: Oropharynx is clear.  ?Eyes:  ?   Conjunctiva/sclera: Conjunctivae normal.  ?Cardiovascular:  ?   Rate and Rhythm: Normal rate and regular rhythm.  ?   Pulses: Normal pulses.  ?   Heart sounds: Normal heart sounds.  ?Pulmonary:  ?   Effort: Pulmonary effort is normal.  ?   Breath sounds: Normal breath sounds.  ?Abdominal:  ?   General: Bowel sounds are normal.  ?Skin: ?   Findings:  Erythema and rash present.  ?Neurological:  ?   Mental Status: She is alert and oriented to person, place, and time.  ? ?The plan was reviewed with the patient/family, and all questions/concerned were addressed. ? ?It was my pleasure to see Natalie Knapp today and participate in her care. Please feel free to contact me with any questions or concerns. ? ?Sincerely, ? ?Florentina Addison NP ?[facility] ?

## 2021-06-05 ENCOUNTER — Encounter: Payer: Medicaid Other | Admitting: Nurse Practitioner

## 2021-06-07 ENCOUNTER — Encounter: Payer: Self-pay | Admitting: Nurse Practitioner

## 2022-04-15 IMAGING — DX DG ANKLE COMPLETE 3+V*R*
3 series · 3 of 3 positions shown · non-contrast
Comparison: None.

CLINICAL DATA: Right ankle pain after stepping in a hole yesterday.

EXAM:
RIGHT ANKLE - COMPLETE 3+ VIEW

[ankle ap]
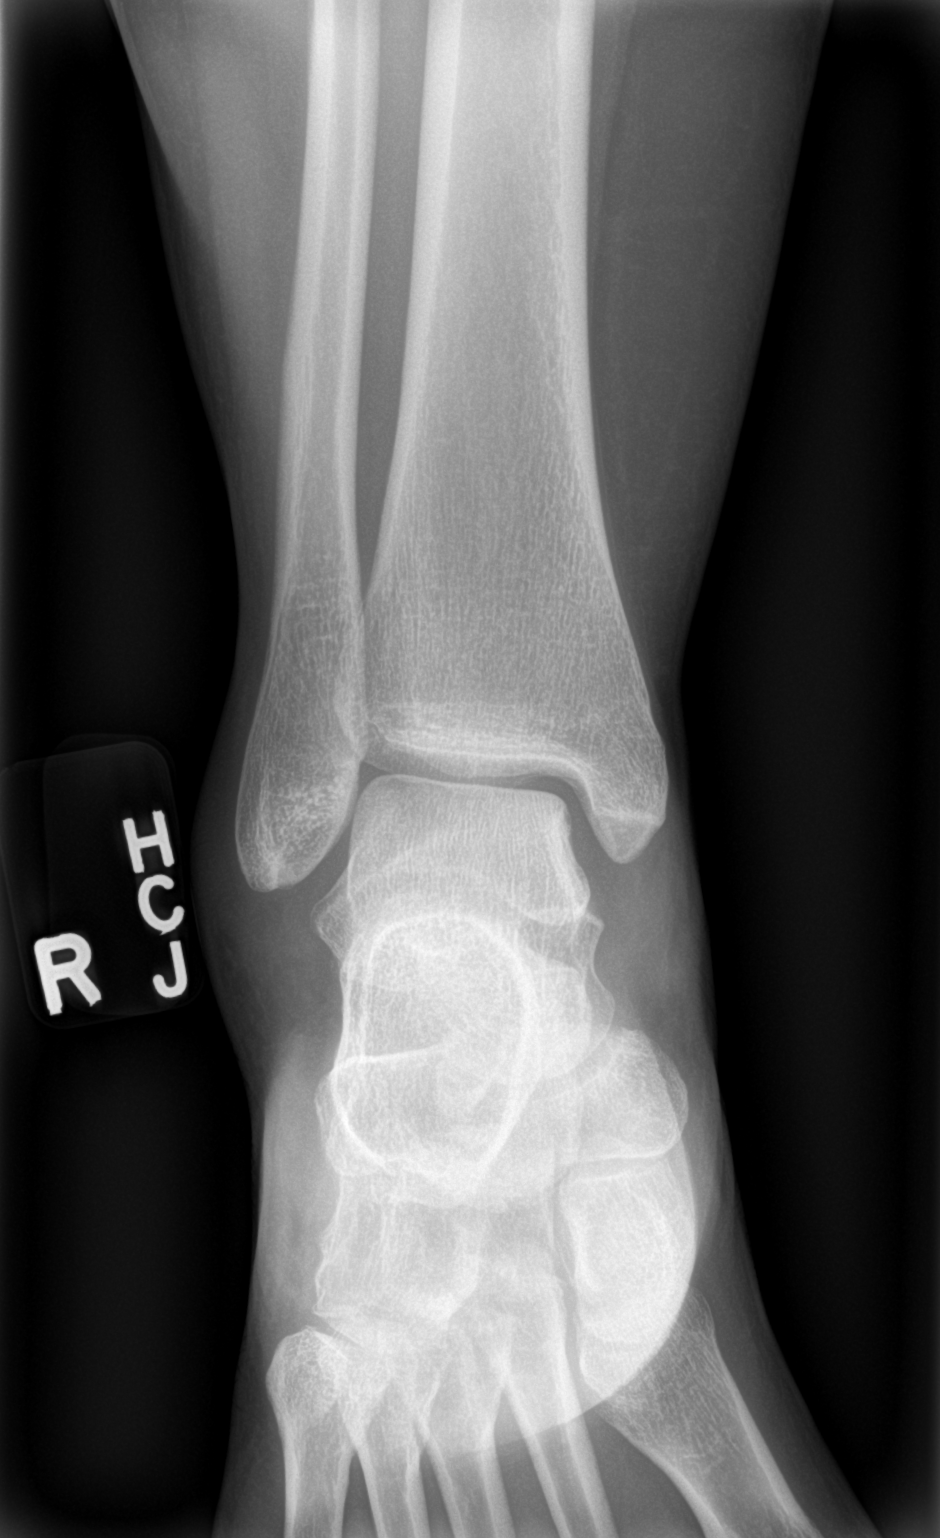

[ankle obl]
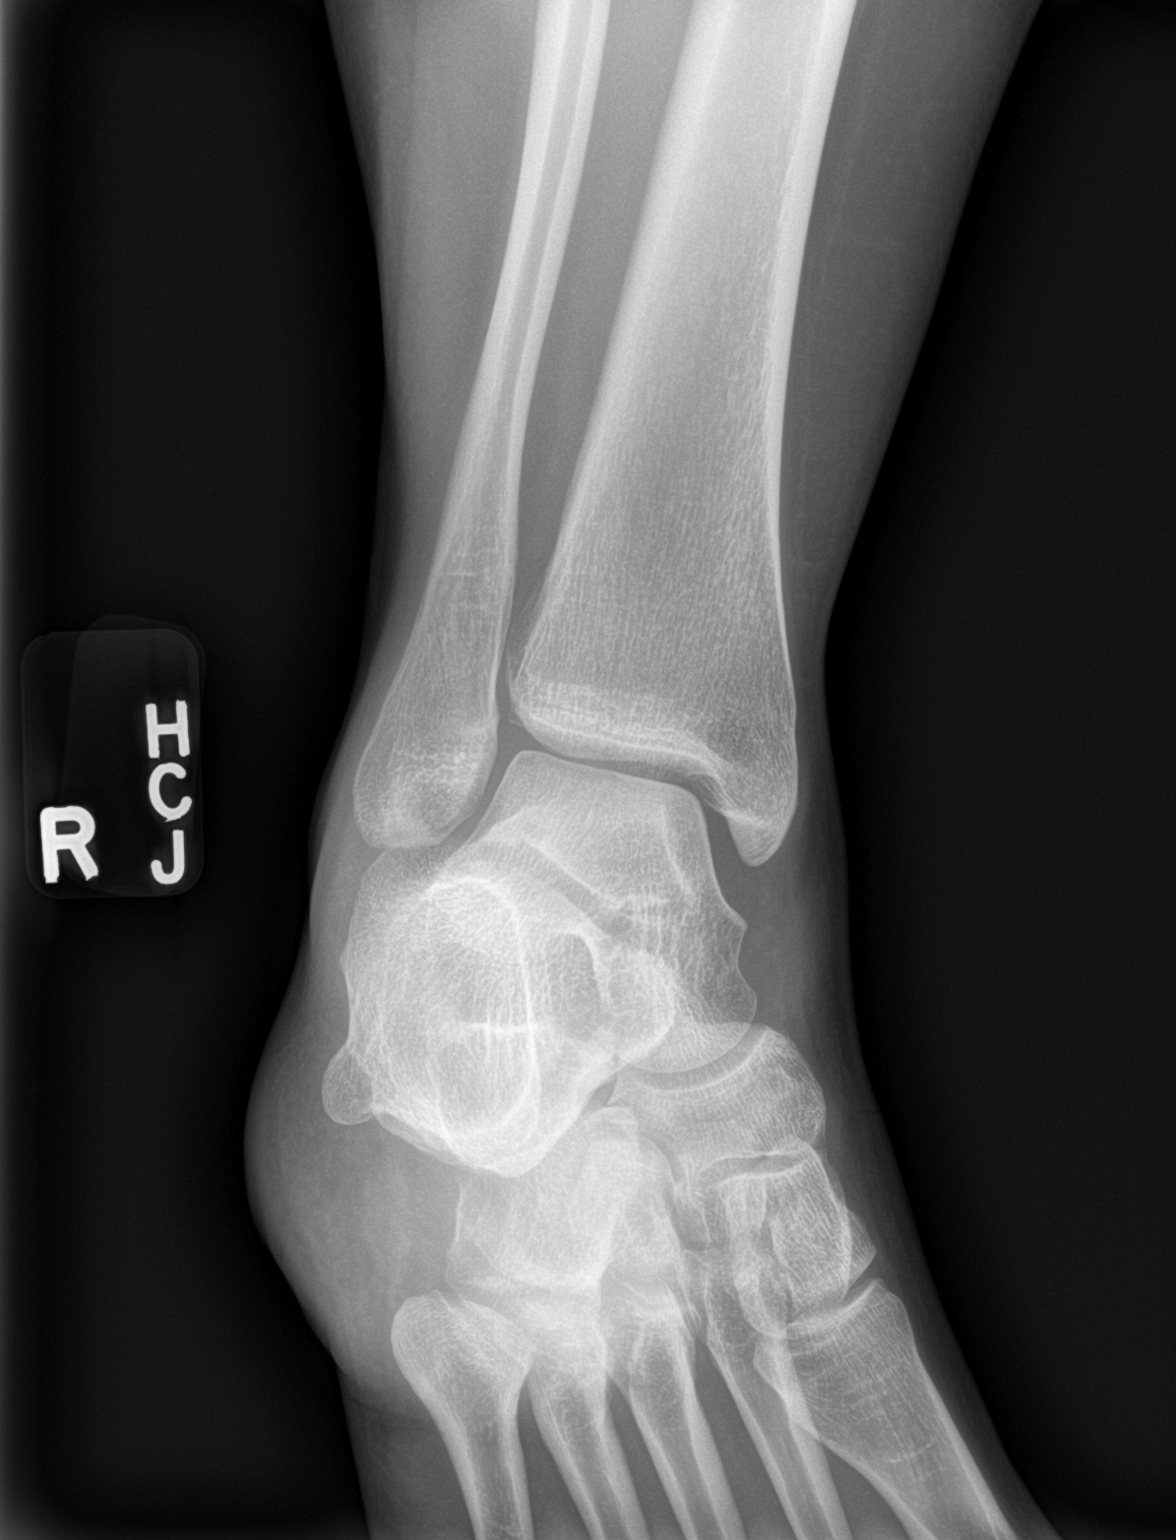

[ankle lat]
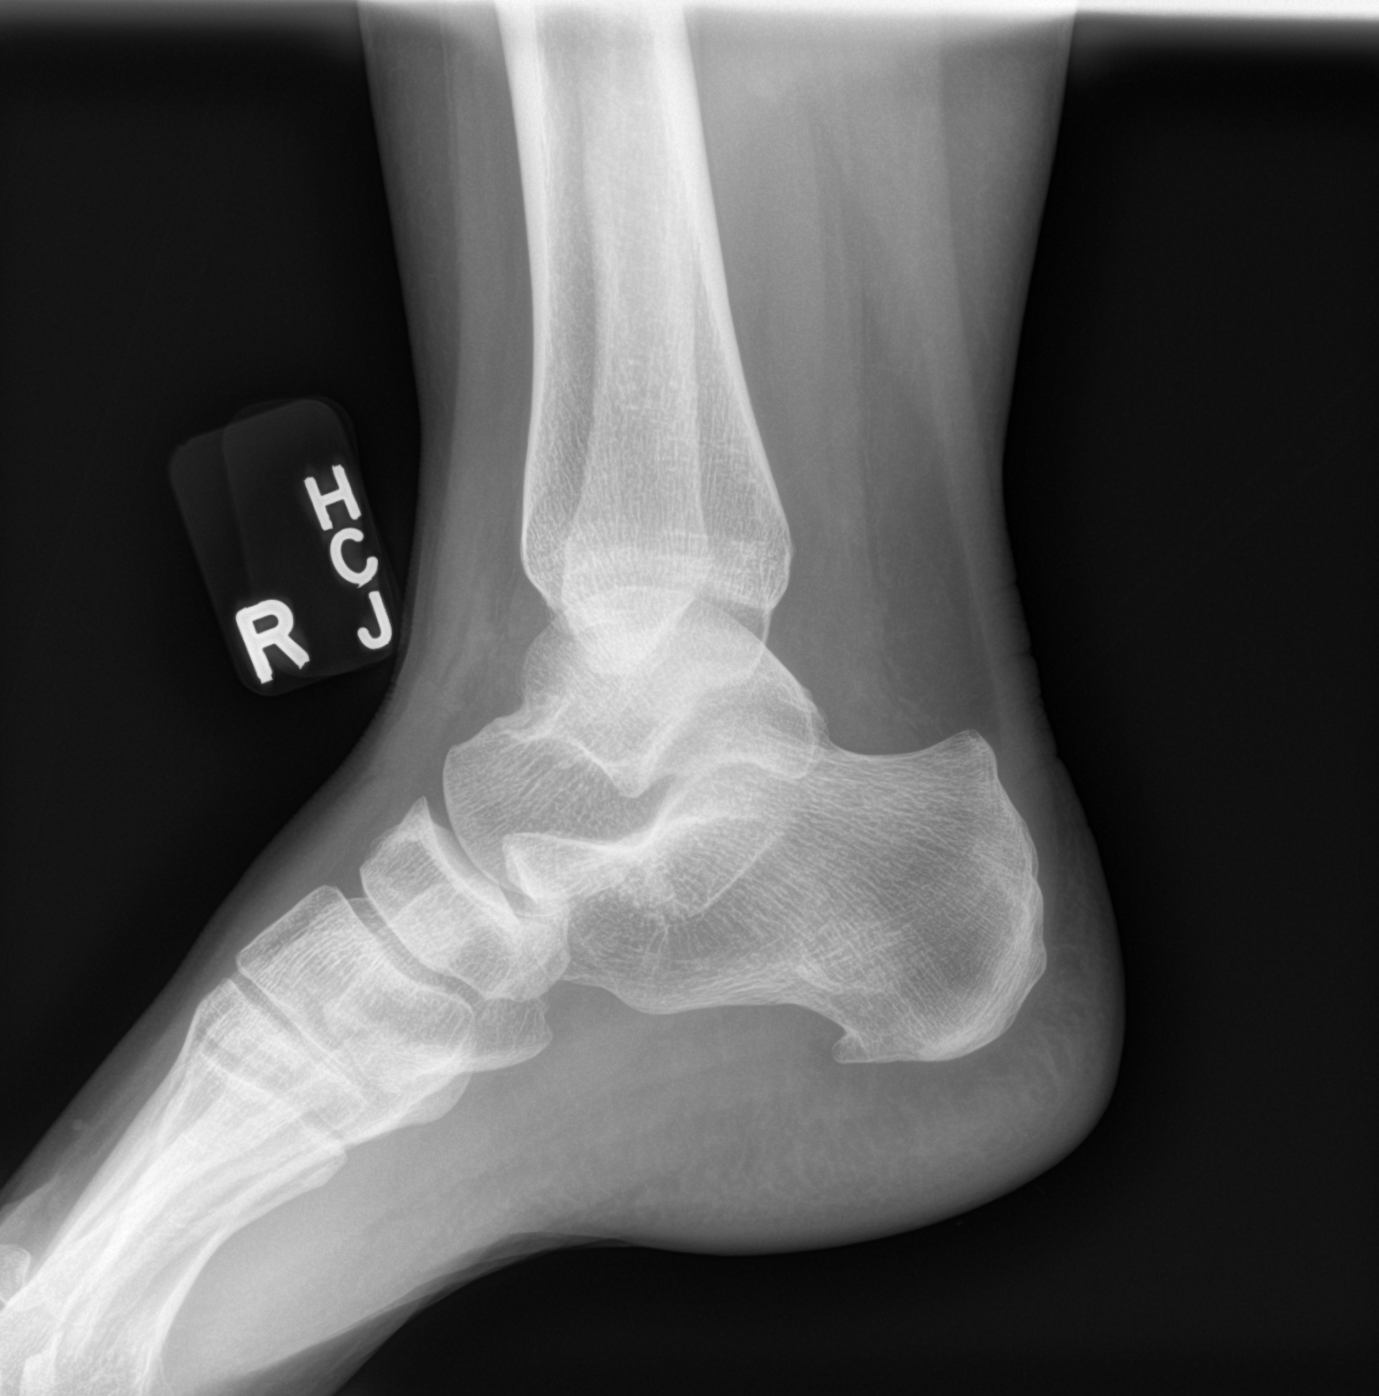

[3 of 3 positions shown; findings below may reference images not displayed]

FINDINGS: No fracture or bone lesion.

Ankle joint normally spaced and aligned.  No arthropathic changes.

Small plantar calcaneal spur.

Anterolateral soft tissue swelling.
IMPRESSION: No fracture or dislocation.
